# Patient Record
Sex: Male | Born: 1950 | Race: White | Hispanic: No | Smoking: Former smoker
Health system: Southern US, Community
[De-identification: ages and names within clinical notes are randomized; demographics above are authoritative.]

## PROBLEM LIST (undated history)

## (undated) DIAGNOSIS — E785 Hyperlipidemia, unspecified: Secondary | ICD-10-CM

## (undated) DIAGNOSIS — D649 Anemia, unspecified: Secondary | ICD-10-CM

## (undated) DIAGNOSIS — M51369 Other intervertebral disc degeneration, lumbar region without mention of lumbar back pain or lower extremity pain: Secondary | ICD-10-CM

## (undated) DIAGNOSIS — M5136 Other intervertebral disc degeneration, lumbar region: Secondary | ICD-10-CM

## (undated) DIAGNOSIS — G4730 Sleep apnea, unspecified: Secondary | ICD-10-CM

## (undated) DIAGNOSIS — K219 Gastro-esophageal reflux disease without esophagitis: Secondary | ICD-10-CM

## (undated) DIAGNOSIS — E119 Type 2 diabetes mellitus without complications: Secondary | ICD-10-CM

## (undated) DIAGNOSIS — I219 Acute myocardial infarction, unspecified: Secondary | ICD-10-CM

## (undated) DIAGNOSIS — I2510 Atherosclerotic heart disease of native coronary artery without angina pectoris: Secondary | ICD-10-CM

## (undated) DIAGNOSIS — M1990 Unspecified osteoarthritis, unspecified site: Secondary | ICD-10-CM

## (undated) DIAGNOSIS — I10 Essential (primary) hypertension: Secondary | ICD-10-CM

## (undated) DIAGNOSIS — J302 Other seasonal allergic rhinitis: Secondary | ICD-10-CM

## (undated) DIAGNOSIS — Z972 Presence of dental prosthetic device (complete) (partial): Secondary | ICD-10-CM

## (undated) DIAGNOSIS — G629 Polyneuropathy, unspecified: Secondary | ICD-10-CM

## (undated) DIAGNOSIS — J189 Pneumonia, unspecified organism: Secondary | ICD-10-CM

## (undated) DIAGNOSIS — F419 Anxiety disorder, unspecified: Secondary | ICD-10-CM

## (undated) HISTORY — PX: CERVICAL FUSION: SHX112

## (undated) HISTORY — PX: COLONOSCOPY: SHX174

## (undated) HISTORY — PX: LUMBAR LAMINECTOMY: SHX95

---

## 1981-08-26 HISTORY — PX: ABDOMINAL SURGERY: SHX537

## 1984-08-26 HISTORY — PX: ABDOMINAL ADHESION SURGERY: SHX90

## 2003-07-08 ENCOUNTER — Ambulatory Visit (HOSPITAL_COMMUNITY): Admission: RE | Admit: 2003-07-08 | Discharge: 2003-07-08 | Payer: Self-pay | Admitting: *Deleted

## 2003-09-06 ENCOUNTER — Ambulatory Visit (HOSPITAL_COMMUNITY): Admission: RE | Admit: 2003-09-06 | Discharge: 2003-09-06 | Payer: Self-pay | Admitting: Neurosurgery

## 2003-10-27 ENCOUNTER — Encounter: Admission: RE | Admit: 2003-10-27 | Discharge: 2003-10-27 | Payer: Self-pay | Admitting: Neurosurgery

## 2008-12-22 ENCOUNTER — Encounter: Admission: RE | Admit: 2008-12-22 | Discharge: 2008-12-22 | Payer: Self-pay | Admitting: Geriatric Medicine

## 2009-02-10 ENCOUNTER — Ambulatory Visit (HOSPITAL_COMMUNITY): Admission: RE | Admit: 2009-02-10 | Discharge: 2009-02-10 | Payer: Self-pay | Admitting: Neurosurgery

## 2009-08-26 DIAGNOSIS — I219 Acute myocardial infarction, unspecified: Secondary | ICD-10-CM | POA: Insufficient documentation

## 2009-08-26 HISTORY — DX: Acute myocardial infarction, unspecified: I21.9

## 2009-08-26 HISTORY — PX: CARDIAC CATHETERIZATION: SHX172

## 2010-04-16 ENCOUNTER — Encounter: Admission: RE | Admit: 2010-04-16 | Discharge: 2010-04-16 | Payer: Self-pay | Admitting: Neurosurgery

## 2010-09-15 ENCOUNTER — Encounter: Payer: Self-pay | Admitting: *Deleted

## 2010-09-25 LAB — TYPE AND SCREEN
ABO/RH(D): A NEG
Antibody Screen: NEGATIVE

## 2010-09-25 LAB — CBC
HCT: 40.4 % (ref 39.0–52.0)
MCHC: 35.6 g/dL (ref 30.0–36.0)
RDW: 11.8 % (ref 11.5–15.5)
WBC: 5.9 10*3/uL (ref 4.0–10.5)

## 2010-09-25 LAB — DIFFERENTIAL
Basophils Absolute: 0 10*3/uL (ref 0.0–0.1)
Basophils Relative: 0 % (ref 0–1)
Lymphocytes Relative: 24 % (ref 12–46)
Neutro Abs: 3.9 10*3/uL (ref 1.7–7.7)
Neutrophils Relative %: 67 % (ref 43–77)

## 2010-09-25 LAB — BASIC METABOLIC PANEL
BUN: 11 mg/dL (ref 6–23)
Calcium: 9.8 mg/dL (ref 8.4–10.5)
GFR calc non Af Amer: >60 mL/min (ref >60)
Glucose, Bld: 149 mg/dL — ABNORMAL HIGH (ref 70–99)
Potassium: 4.2 mEq/L (ref 3.5–5.1)
Sodium: 140 mEq/L (ref 135–145)

## 2010-09-25 LAB — SURGICAL PCR SCREEN: Staphylococcus aureus: NEGATIVE

## 2010-09-28 ENCOUNTER — Ambulatory Visit (HOSPITAL_COMMUNITY)
Admission: RE | Admit: 2010-09-28 | Discharge: 2010-09-29 | Disposition: A | Discharge status: Home / Self Care | Payer: BC Managed Care – PPO | Attending: Neurosurgery | Admitting: Neurosurgery

## 2010-09-28 ENCOUNTER — Ambulatory Visit (HOSPITAL_COMMUNITY): Payer: BC Managed Care – PPO

## 2010-09-28 ENCOUNTER — Encounter (HOSPITAL_COMMUNITY): Payer: BC Managed Care – PPO

## 2010-09-28 ENCOUNTER — Other Ambulatory Visit (HOSPITAL_COMMUNITY): Payer: Self-pay | Admitting: Neurosurgery

## 2010-09-28 DIAGNOSIS — Z01811 Encounter for preprocedural respiratory examination: Secondary | ICD-10-CM

## 2010-09-28 DIAGNOSIS — M5126 Other intervertebral disc displacement, lumbar region: Principal | ICD-10-CM | POA: Insufficient documentation

## 2010-09-28 DIAGNOSIS — I2510 Atherosclerotic heart disease of native coronary artery without angina pectoris: Secondary | ICD-10-CM | POA: Insufficient documentation

## 2010-09-28 DIAGNOSIS — K219 Gastro-esophageal reflux disease without esophagitis: Secondary | ICD-10-CM | POA: Insufficient documentation

## 2010-09-28 DIAGNOSIS — I10 Essential (primary) hypertension: Secondary | ICD-10-CM | POA: Insufficient documentation

## 2010-11-01 NOTE — Op Note (Signed)
NAMEMarland Mcguire  MAHAMUD, METTS                  ACCOUNT NO.:  [id number]  MEDICAL RECORD NO.:  [id number]           PATIENT TYPE:  O  LOCATION:  3536                         FACILITY:  [facility]  PHYSICIAN:  Catherine Oak A. Catheryn Slifer, M.D.    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  PROCEDURE:  Re-exploration of left L4-5 laminotomy with left L4-5 redo microdiskectomy.  SURGEON:  Kathaleen Maser. Elloise Roark, MD  ASSISTANT:  Donalee Citrin, MD  ANESTHESIA:  General endotracheal.  INDICATION:  [name] is a 60 year old male status post previous bilateral L4-5 decompressive laminotomies who presents now with signs and symptoms of a recurrent disk herniation at L4-5.  The patient has failed conservative management.  MRI scan demonstrates a large left- sided L4-5 recurrent disk herniation and plan is for re-exploration of laminotomy with redo microdiskectomy.  OPERATIVE NOTE:  The patient was placed on operating table in supine position.  After an adequate level of anesthesia achieved, the patient was placed prone onto Wilson frame.  Appropriately padded the patient's lumbar region, prepped and draped sterilely.  A 10 blade was used to make a curvilinear skin incision overlying the L4-5 interspace.  This was carried down sharply in midline. Subperiosteal dissection was then performed exposing the lamina and facet joints L4 and L5.  Deep self- retaining retractor was placed.  Intraoperative x-rays taken, and the level was confirmed.  Intraoperative x-rays taken level was confirmed. Previous laminotomy site was then dissected free using dental instruments.  The laminotomy was widened slightly using 2-mm Kerrison rongeurs.  Underlying thecal sac and L5 nerve  root were identified. Microscope was then brought into the field.  Using microdissection of the left-sided L5 nerve root and underlying disk herniation.  Epidural venous plexus was coagulated and cut.  L5 nerve root and thecal sac was gently mobilized towards the midline.  Disk herniation was apparent, this incised with 15 blade in a rectangular fashion.  Wide disk space clean-out was then achieved using pituitary rongeurs, orthopaedic rongeurs, and Epstein curettes.  All amounts of the disk herniation were completely resected.  All loose degenerative disk material was then removed from the interspace.  At this point, a very thorough diskectomy was achieved.  There was no injury to thecal sac or nerve root.  The wound was then irrigated with antibiotic solution.  Gelfoam was placed topically for hemostasis and found to be good.  Microscope and retractor system were removed. Hemostasis was then achieve with electrocautery.  The wound was closed in layers with Vicryl suture.  Steri-Strips and sterile dressings were applied.  No complications.  The patient is well, and he returns to recovery room postoperatively.          ______________________________ Kathaleen Maser Adyn Serna, M.D.     HAP/MEDQ  D:  09/28/2010  T:  09/28/2010  Job:  [id number]  Electronically Signed by Julio Sicks M.D. on 10/31/2010 11:19:29  PM

## 2010-12-03 LAB — CBC
MCHC: 35.2 g/dL (ref 30.0–36.0)
MCV: 94.6 fL (ref 78.0–100.0)
Platelets: 197 10*3/uL (ref 150–400)
RDW: 12.9 % (ref 11.5–15.5)

## 2010-12-03 LAB — TYPE AND SCREEN
ABO/RH(D): A NEG
Antibody Screen: NEGATIVE

## 2010-12-03 LAB — DIFFERENTIAL
Basophils Absolute: 0 10*3/uL (ref 0.0–0.1)
Basophils Relative: 0 % (ref 0–1)
Eosinophils Absolute: 0.1 10*3/uL (ref 0.0–0.7)
Monocytes Relative: 7 % (ref 3–12)
Neutro Abs: 3.9 10*3/uL (ref 1.7–7.7)
Neutrophils Relative %: 71 % (ref 43–77)

## 2011-01-08 NOTE — Op Note (Signed)
NAME:  Bryan Mcguire, Bryan Mcguire                  ACCOUNT NO.:  [id number]   MEDICAL RECORD NO.:  [id number]          PATIENT TYPE:  OIB   LOCATION:  3524                         FACILITY:  [facility]   PHYSICIAN:  Kathaleen Maser. Pool, M.D.    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   POSTOPERATIVE DIAGNOSES:  L4-5 stenosis with left L4-5 herniated nucleus  pulposus and radiculopathy.   PROCEDURE NAME:  Bilateral L4-5 decompressive laminotomies with left L4-  5 microdiskectomy.   SURGEON:  Kathaleen Maser. Pool, MD   ASSISTANT:  Tia Alert, MD   ANESTHESIA:  General endotracheal.   INDICATIONS:  Bryan Mcguire is a 60 year old male with history of bilateral  lower extremity pain, paresthesias, and weakness consistent with  neurogenic claudication with a superimposed left L5 radiculopathy.  Patient has failed conservative management.  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  The patient has been counseled as to his options.  He decided to proceed  with L4-5 decompressive laminotomies and left-sided L4-5 microdiskectomy  in hopes of improving his symptoms.   OPERATION:  The patient was brought to the operating room, placed on the  operating table in supine position.  After adequate level of anesthesia  was achieved, the patient was placed prone onto Wilson frame,  appropriately padded.  The patient's lumbar region was prepped and  draped in sterilely.  A #10 blade was used to make a curvilinear skin  incision overlying the L4-5 interspace.  This was carried down sharply  in the midline.  A subperiosteal dissection was then performed exposing  the lamina and facet joints of L4 and L5.  A deep  self-retaining  retractor was placed.  Intraoperative x-ray was taken, and in fact, the  L3-4 level was exposed.  Dissection was then redirected to the L4-5  level.  Retractor was then placed.  Bilateral laminotomies were then  performed using high-speed drill and Kerrison rongeurs to remove the  inferior aspect of the lamina, above medial aspect of the L4-5 facet  joint, superior rim of the L5 lamina.  Ligament flavum was then elevated  and resected in piecemeal fashion using Kerrison rongeurs.  Underlying  thecal sac and exiting L5 nerve roots were identified bilaterally.  Wide  decompressive foraminotomies were then performed along the course of  exiting L4 and L5 nerve roots bilaterally.  Epidural venous plexus was  coagulated and cut.  Microscope was then brought into the field and used  for microdissection.  Starting on the left side, thecal sac and nerve  roots were gently mobilized, retracted towards the midline.  Disk  herniation was then apparent.  This was then incised with #15 blade in a  rectangular fashion.  Wide disk space clean-out was achieved using  pituitary rongeurs, upward angled pituitary rongeurs, and Epstein  curettes.  All elements of the disk herniation were completely resected.  All loose or obviously degenerative disk material was then removed from  the interspace.  At this point, a very thorough diskectomy was achieved.  There was no obvious injury to thecal sac or nerve roots.  The wound was  then irrigated with antibiotic solution.  Gelfoam was placed topically  for hemostasis which was found to be good.  Microscope and retractor  system were removed.  Hemostasis  was then achieved with electrocautery.  Wound was then closed in layers  with Vicryl sutures.  Steri-Strips and sterile dressing were applied.  There were no complications.  The patient tolerated the procedure well  and he returned to the recovery room postoperatively.            ______________________________  Kathaleen Maser Pool, M.D.     HAP/MEDQ  D:  02/10/2009  T:  02/11/2009  Job:  [id number]

## 2011-01-11 NOTE — Op Note (Signed)
NAMEMarland Kitchen  Bryan Mcguire, Bryan Mcguire                              ACCOUNT NO.:  [id number]   MEDICAL RECORD NO.:  [id number]                   PATIENT TYPE:  OIB   LOCATION:  3013                                 FACILITY:  [facility]   PHYSICIAN:  Kathaleen Maser. Mcguire, M.D.                 DATE OF BIRTH:  1951-02-11   DATE OF PROCEDURE:  09/06/2003  DATE OF DISCHARGE:                                 OPERATIVE REPORT   PREOPERATIVE DIAGNOSIS:  C5-6 and C6-7 herniated nucleus pulposus with  myelopathy.   POSTOPERATIVE DIAGNOSIS:  C5-6 and C6-7 herniated nucleus pulposus with  myelopathy.   PROCEDURE:  C5-6 and C6-7 anterior cervical decompression and fusion with  allograft and anterior plating.   SURGEON:  Kathaleen Maser. Mcguire, M.D.   ASSISTANT:  Tia Alert, MD   ANESTHESIA:  General endotracheal anesthesia.   INDICATIONS FOR PROCEDURE:  The patient is a 60 year old male with a history  of neck and left upper extremity pain, paresthesias and weakness consistent  with both a left sided C6 and C7 radiculopathy as well as early evidence of  a cervical myelopathy as well.  MRI scanning demonstrates very large disk  herniations off to the left side with associated spondylosis at C5-6 and C6-  7 causing marked spinal cord and exiting nerve root compression.  The  patient has been counseled as to his options.  He has decided to proceed  with two level anterior cervical decompression and fusion with Allograft  anterior plating.   DESCRIPTION OF PROCEDURE:  The patient was brought to the operating room and  placed on the operating table in the supine position.  After an adequate  level of anesthesia was achieved the patient was placed supine with the neck  slightly extended and the head placed in halter traction.  The patient's  anterior cervical region was prepped and draped sterilely.  A 10 blade was  used to make a linear incision overlying the C6 vertebral level. This was  carried down sharply to the platysma.  The  platysma was divided vertically  and dissection proceeded along the medial border of the sternocleidomastoid  muscle and carotid sheath.  The trachea and esophagus were mobilized and  retracted towards the left.  The prevertebral fascia was stripped off the  anterior spinal column.  The longus colli muscles and elevated bilaterally  using electrocautery.  A deep self retaining retractor was placed.  Intraoperative fluoroscopy was used and the C5-6 and C6-7 levels were  confirmed.  The disk spaces at both levels were then incised with a 15 blade  in a rectangular fashion to widen the disk space was then created and  achieved using pituitary rongeurs, forward and backward biting Carlen  curettes, Kerrison rongeur and a high speed drill.  All the rest of the  disks was removed down to the level of the posterior annulus at both  levels.  The microscope was brought onto the field to carry out the remainder of the  diskectomy removing the anterior aspect of the annulus.  The osteophytes  were removed using the high speed drill down to the level of the posterior  longitudinal ligament.  The posterior longitudinal ligament was then  elevated and resected in a piecemeal fashion using Kerrison.  The underlying  thecal sac was then identified.  A wide central decompression was performed  by under cutting the bodies of C5 and C6.  Decompression then proceeded out  to the anterior foramen.  A wide anterior foraminotomy was performed along  the course of the exiting C6 nerve root.  The spondylitic spur and disk  herniation were completely resected.  Decompression then proceeded to the  right sided C6 foramen.  Once again, a wide anterior foraminotomy was  performed fully exposing the exiting nerve root.  A blunt probe was used to  dissect both superiorly and inferiorly at each foramen.  The diskectomy was  completed at the C6-7 level in a similar fashion.  There were no  complications with either level.   The wounds were then irrigated with  antibiotic solution.  The wound was inspected for hemostasis and was found  to be good.  A 7 mm patellar wedge allograft was then impacted in place and  recessed approximately 1 mm from the anterior cortical margin at C5-6.  An 8  mm patellar wedge was impacted at C6-7.  A 45 mm Atlantis anterior cervical  plate was then placed over the C5-6 and C6-7 levels.  This was then attached  under fluoroscopic guidance using 15 mm variable angle screws. All six  screws were again finally tightened and found to be solid in the bone.  The  locking screws were engaged at all three levels.  Final images revealed good  position of the bone graft and hardware and proper level in the spine.  The  wound was then irrigated with saline solution.  Hemostasis was ensured with  bipolar electrocautery.  The wound was then closed in a typical fashion.  Steri-Strips and a sterile dressing were applied.  There were no  complications.  The patient tolerated the procedure well and he returns to  the recovery room postoperatively.                                               Bryan Mcguire, M.D.    HAP/MEDQ  D:  09/06/2003  T:  09/06/2003  Job:  [id number]

## 2012-07-30 ENCOUNTER — Other Ambulatory Visit: Payer: Self-pay | Admitting: Orthopedic Surgery

## 2012-07-30 ENCOUNTER — Encounter (HOSPITAL_BASED_OUTPATIENT_CLINIC_OR_DEPARTMENT_OTHER): Payer: Self-pay | Admitting: *Deleted

## 2012-07-30 NOTE — Progress Notes (Signed)
Pt came in from office before he was scheduled He had labs about a month ago-sees dr Dulce Sellar cardiology-called for notes from both offices to send today, Had lumb lam cone 2012

## 2012-07-31 ENCOUNTER — Encounter (HOSPITAL_BASED_OUTPATIENT_CLINIC_OR_DEPARTMENT_OTHER): Payer: Self-pay | Admitting: Orthopedic Surgery

## 2012-07-31 ENCOUNTER — Encounter (HOSPITAL_BASED_OUTPATIENT_CLINIC_OR_DEPARTMENT_OTHER): Payer: Self-pay | Admitting: Anesthesiology

## 2012-07-31 ENCOUNTER — Encounter (HOSPITAL_BASED_OUTPATIENT_CLINIC_OR_DEPARTMENT_OTHER)
Admission: RE | Disposition: A | Discharge status: Home / Self Care | Payer: Self-pay | Source: Ambulatory Visit | Attending: Orthopedic Surgery

## 2012-07-31 ENCOUNTER — Encounter (HOSPITAL_BASED_OUTPATIENT_CLINIC_OR_DEPARTMENT_OTHER): Payer: Self-pay | Admitting: *Deleted

## 2012-07-31 ENCOUNTER — Ambulatory Visit (HOSPITAL_BASED_OUTPATIENT_CLINIC_OR_DEPARTMENT_OTHER)
Admission: RE | Admit: 2012-07-31 | Discharge: 2012-07-31 | Disposition: A | Discharge status: Home / Self Care | Payer: Medicare Other | Source: Ambulatory Visit | Attending: Orthopedic Surgery | Admitting: Orthopedic Surgery

## 2012-07-31 ENCOUNTER — Ambulatory Visit (HOSPITAL_BASED_OUTPATIENT_CLINIC_OR_DEPARTMENT_OTHER): Payer: Medicare Other | Admitting: Anesthesiology

## 2012-07-31 DIAGNOSIS — I10 Essential (primary) hypertension: Secondary | ICD-10-CM | POA: Insufficient documentation

## 2012-07-31 DIAGNOSIS — M6530 Trigger finger, unspecified finger: Secondary | ICD-10-CM | POA: Insufficient documentation

## 2012-07-31 DIAGNOSIS — M65839 Other synovitis and tenosynovitis, unspecified forearm: Secondary | ICD-10-CM | POA: Insufficient documentation

## 2012-07-31 HISTORY — DX: Gastro-esophageal reflux disease without esophagitis: K21.9

## 2012-07-31 HISTORY — DX: Other seasonal allergic rhinitis: J30.2

## 2012-07-31 HISTORY — PX: TRIGGER FINGER RELEASE: SHX641

## 2012-07-31 HISTORY — DX: Acute myocardial infarction, unspecified: I21.9

## 2012-07-31 HISTORY — DX: Essential (primary) hypertension: I10

## 2012-07-31 HISTORY — DX: Unspecified osteoarthritis, unspecified site: M19.90

## 2012-07-31 HISTORY — DX: Polyneuropathy, unspecified: G62.9

## 2012-07-31 HISTORY — DX: Atherosclerotic heart disease of native coronary artery without angina pectoris: I25.10

## 2012-07-31 HISTORY — DX: Presence of dental prosthetic device (complete) (partial): Z97.2

## 2012-07-31 HISTORY — DX: Other intervertebral disc degeneration, lumbar region: M51.36

## 2012-07-31 HISTORY — DX: Other intervertebral disc degeneration, lumbar region without mention of lumbar back pain or lower extremity pain: M51.369

## 2012-07-31 SURGERY — RELEASE, A1 PULLEY, FOR TRIGGER FINGER
Anesthesia: Monitor Anesthesia Care | Site: Finger | Laterality: Right | Wound class: Clean

## 2012-07-31 MED ORDER — LACTATED RINGERS IV SOLN
INTRAVENOUS | Status: DC
  Administered 2012-07-31: 12:00:00 via INTRAVENOUS

## 2012-07-31 MED ORDER — 0.9 % SODIUM CHLORIDE (POUR BTL) OPTIME
TOPICAL | Status: DC | PRN
  Administered 2012-07-31: 200 mL

## 2012-07-31 MED ORDER — CEFAZOLIN SODIUM-DEXTROSE 2-3 GM-% IV SOLR: 2.0000 g | INTRAVENOUS | Status: DC

## 2012-07-31 MED ORDER — KETOROLAC TROMETHAMINE 30 MG/ML IJ SOLN
INTRAMUSCULAR | Status: DC | PRN
  Administered 2012-07-31: 30 mg via INTRAVENOUS

## 2012-07-31 MED ORDER — HYDROCODONE-ACETAMINOPHEN 5-325 MG PO TABS: 1.0000 | ORAL_TABLET | Freq: Four times a day (QID) | ORAL | Status: DC | PRN

## 2012-07-31 MED ORDER — PROPOFOL 10 MG/ML IV EMUL
INTRAVENOUS | Status: DC | PRN
  Administered 2012-07-31: 100 ug/kg/min via INTRAVENOUS

## 2012-07-31 MED ORDER — LIDOCAINE HCL (PF) 0.5 % IJ SOLN
INTRAMUSCULAR | Status: DC | PRN
  Administered 2012-07-31: 30 mL via INTRAVENOUS

## 2012-07-31 MED ORDER — MIDAZOLAM HCL 5 MG/5ML IJ SOLN
INTRAMUSCULAR | Status: DC | PRN
  Administered 2012-07-31 (×2): 1 mg via INTRAVENOUS

## 2012-07-31 MED ORDER — CHLORHEXIDINE GLUCONATE 4 % EX LIQD: 60.0000 mL | Freq: Once | CUTANEOUS | Status: DC

## 2012-07-31 MED ORDER — LIDOCAINE HCL (CARDIAC) 20 MG/ML IV SOLN
INTRAVENOUS | Status: DC | PRN
  Administered 2012-07-31: 30 mg via INTRAVENOUS

## 2012-07-31 MED ORDER — HYDROMORPHONE HCL PF 1 MG/ML IJ SOLN: 0.2500 mg | INTRAMUSCULAR | Status: DC | PRN

## 2012-07-31 MED ORDER — ONDANSETRON HCL 4 MG/2ML IJ SOLN
INTRAMUSCULAR | Status: DC | PRN
  Administered 2012-07-31: 4 mg via INTRAVENOUS

## 2012-07-31 MED ORDER — KETOROLAC TROMETHAMINE 15 MG/ML IJ SOLN: INTRAMUSCULAR | Status: DC | PRN

## 2012-07-31 MED ORDER — OXYCODONE HCL 5 MG/5ML PO SOLN: 5.0000 mg | Freq: Once | ORAL | Status: DC | PRN

## 2012-07-31 MED ORDER — FENTANYL CITRATE 0.05 MG/ML IJ SOLN
INTRAMUSCULAR | Status: DC | PRN
  Administered 2012-07-31 (×2): 50 ug via INTRAVENOUS

## 2012-07-31 MED ORDER — OXYCODONE HCL 5 MG PO TABS: 5.0000 mg | ORAL_TABLET | Freq: Once | ORAL | Status: DC | PRN

## 2012-07-31 MED ORDER — BUPIVACAINE HCL (PF) 0.25 % IJ SOLN
INTRAMUSCULAR | Status: DC | PRN
  Administered 2012-07-31: 9 mL

## 2012-07-31 SURGICAL SUPPLY — 36 items
BANDAGE COBAN STERILE 2 (GAUZE/BANDAGES/DRESSINGS) ×2 IMPLANT
BLADE SURG 15 STRL LF DISP TIS (BLADE) ×1 IMPLANT
BLADE SURG 15 STRL SS (BLADE) ×1
BNDG ESMARK 4X9 LF (GAUZE/BANDAGES/DRESSINGS) IMPLANT
CHLORAPREP W/TINT 26ML (MISCELLANEOUS) ×2 IMPLANT
CLOTH BEACON ORANGE TIMEOUT ST (SAFETY) ×2 IMPLANT
CORDS BIPOLAR (ELECTRODE) ×2 IMPLANT
COVER MAYO STAND STRL (DRAPES) ×2 IMPLANT
COVER TABLE BACK 60X90 (DRAPES) ×2 IMPLANT
CUFF TOURNIQUET SINGLE 18IN (TOURNIQUET CUFF) ×2 IMPLANT
DECANTER SPIKE VIAL GLASS SM (MISCELLANEOUS) ×2 IMPLANT
DRAPE EXTREMITY T 121X128X90 (DRAPE) ×2 IMPLANT
DRAPE SURG 17X23 STRL (DRAPES) ×2 IMPLANT
GAUZE XEROFORM 1X8 LF (GAUZE/BANDAGES/DRESSINGS) ×2 IMPLANT
GLOVE BIO SURGEON STRL SZ 6.5 (GLOVE) ×2 IMPLANT
GLOVE BIO SURGEON STRL SZ7 (GLOVE) ×2 IMPLANT
GLOVE BIOGEL PI IND STRL 7.0 (GLOVE) ×1 IMPLANT
GLOVE BIOGEL PI IND STRL 8.5 (GLOVE) ×1 IMPLANT
GLOVE BIOGEL PI INDICATOR 7.0 (GLOVE) ×1
GLOVE BIOGEL PI INDICATOR 8.5 (GLOVE) ×1
GLOVE SURG ORTHO 8.0 STRL STRW (GLOVE) ×2 IMPLANT
GOWN BRE IMP PREV XXLGXLNG (GOWN DISPOSABLE) ×2 IMPLANT
GOWN PREVENTION PLUS XLARGE (GOWN DISPOSABLE) ×2 IMPLANT
NEEDLE 27GAX1X1/2 (NEEDLE) ×2 IMPLANT
NS IRRIG 1000ML POUR BTL (IV SOLUTION) ×2 IMPLANT
PACK BASIN DAY SURGERY FS (CUSTOM PROCEDURE TRAY) ×2 IMPLANT
PADDING CAST ABS 4INX4YD NS (CAST SUPPLIES) ×1
PADDING CAST ABS COTTON 4X4 ST (CAST SUPPLIES) ×1 IMPLANT
SPONGE GAUZE 4X4 12PLY (GAUZE/BANDAGES/DRESSINGS) ×2 IMPLANT
STOCKINETTE 4X48 STRL (DRAPES) ×2 IMPLANT
SUT VICRYL RAPIDE 4/0 PS 2 (SUTURE) ×2 IMPLANT
SYR BULB 3OZ (MISCELLANEOUS) ×2 IMPLANT
SYR CONTROL 10ML LL (SYRINGE) ×2 IMPLANT
TOWEL OR 17X24 6PK STRL BLUE (TOWEL DISPOSABLE) ×2 IMPLANT
UNDERPAD 30X30 INCONTINENT (UNDERPADS AND DIAPERS) ×2 IMPLANT
WATER STERILE IRR 1000ML POUR (IV SOLUTION) IMPLANT

## 2012-07-31 NOTE — Brief Op Note (Signed)
07/31/2012  1:25 PM  PATIENT:  Bryan Mcguire  61 y.o. male  PRE-OPERATIVE DIAGNOSIS:  Stenosing Tenosynovitis Right Middle Finger, Right Ring Finger, and Right Small Finger - Hand  Pain and Stiffness Fingers  POST-OPERATIVE DIAGNOSIS:  Stenosing Tenosynovitis Right Middle Finger, Right Ring Finger, and Right Small Finger - Hand  Pain and Stiffness Fingers  PROCEDURE:  Procedure(s) (LRB) with comments: RELEASE TRIGGER FINGER/A-1 PULLEY (Right) - Right Middle Finger; Right Ring Finger, and Right Small Finger  SURGEON:  Surgeon(s) and Role:    * Nicki Reaper, MD - Primary  PHYSICIAN ASSISTANT:   ASSISTANTS: none   ANESTHESIA:   local and regional  EBL:  Total I/O In: 800 [I.V.:800] Out: -   BLOOD ADMINISTERED:none  DRAINS: none   LOCAL MEDICATIONS USED:  MARCAINE     SPECIMEN:  No Specimen  DISPOSITION OF SPECIMEN:  N/A  COUNTS:  YES  TOURNIQUET:   Total Tourniquet Time Documented: Forearm (Right) - 27 minutes  DICTATION: .Other Dictation: Dictation Number [id number] PLAN OF CARE: Discharge to home after PACU  PATIENT DISPOSITION:  PACU - hemodynamically stable.
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
## 2012-07-31 NOTE — Op Note (Signed)
Other Dictation: Dictation Number [id number]

## 2012-07-31 NOTE — Anesthesia Preprocedure Evaluation (Signed)
Anesthesia Evaluation  Patient identified by MRN, date of birth, ID band Patient awake    Reviewed: Allergy & Precautions, H&P , NPO status , Patient's Chart, lab work & pertinent test results, reviewed documented beta blocker date and time   Airway Mallampati: III TM Distance: >3 FB Neck ROM: Full    Dental No notable dental hx. (+) Upper Dentures and Dental Advisory Given   Pulmonary neg pulmonary ROS,  breath sounds clear to auscultation  Pulmonary exam normal       Cardiovascular hypertension, On Medications and On Home Beta Blockers + CAD, + Past MI and + Cardiac Stents Rhythm:Regular Rate:Normal     Neuro/Psych negative neurological ROS  negative psych ROS   GI/Hepatic negative GI ROS, Neg liver ROS, GERD-  Medicated and Controlled,  Endo/Other  negative endocrine ROS  Renal/GU negative Renal ROS  negative genitourinary   Musculoskeletal   Abdominal   Peds  Hematology negative hematology ROS (+)   Anesthesia Other Findings   Reproductive/Obstetrics negative OB ROS                           Anesthesia Physical Anesthesia Plan  ASA: III  Anesthesia Plan: MAC and Bier Block   Post-op Pain Management:    Induction: Intravenous  Airway Management Planned: Simple Face Mask  Additional Equipment:   Intra-op Plan:   Post-operative Plan:   Informed Consent: I have reviewed the patients History and Physical, chart, labs and discussed the procedure including the risks, benefits and alternatives for the proposed anesthesia with the patient or authorized representative who has indicated his/her understanding and acceptance.   Dental advisory given  Plan Discussed with: CRNA  Anesthesia Plan Comments:         Anesthesia Quick Evaluation

## 2012-07-31 NOTE — Anesthesia Procedure Notes (Signed)
Procedure Name: MAC Date/Time: 07/31/2012 12:45 PM Performed by: Verlan Friends Pre-anesthesia Checklist: Patient identified, Timeout performed, Emergency Drugs available, Suction available and Patient being monitored Patient Re-evaluated:Patient Re-evaluated prior to inductionOxygen Delivery Method: Simple face mask Placement Confirmation: positive ETCO2

## 2012-07-31 NOTE — Transfer of Care (Signed)
Immediate Anesthesia Transfer of Care Note  Patient: Bryan Mcguire  Procedure(s) Performed: Procedure(s) (LRB) with comments: RELEASE TRIGGER FINGER/A-1 PULLEY (Right) - Right Middle Finger; Right Ring Finger, and Right Small Finger  Patient Location: PACU  Anesthesia Type:Bier block  Level of Consciousness: awake, alert , oriented and patient cooperative  Airway & Oxygen Therapy: Patient Spontanous Breathing and Patient connected to face mask oxygen  Post-op Assessment: Report given to PACU RN and Post -op Vital signs reviewed and stable  Post vital signs: Reviewed and stable  Complications: No apparent anesthesia complications

## 2012-08-01 NOTE — Op Note (Signed)
NAMEMarland Kitchen  Bryan Mcguire, Bryan Mcguire                  ACCOUNT NO.:  [id number]  MEDICAL RECORD NO.:  [id number]  LOCATION:                                 FACILITY:  PHYSICIAN:  Cindee Salt, M.D.            DATE OF BIRTH:  DATE OF PROCEDURE:  07/31/2012 DATE OF DISCHARGE:                              OPERATIVE REPORT   PREOPERATIVE DIAGNOSIS:  Stenosing tenosynovitis, right middle ring and little fingers.  POSTOPERATIVE DIAGNOSIS:  Stenosing tenosynovitis, right middle ring and little fingers.  OPERATION:  Decompression of A1 pulleys, right middle, right ring, right little finger.  SURGEON:  Cindee Salt, MD  ANESTHESIA:  Forearm-based IV regional with local infiltration.  ANESTHESIOLOGIST:  Zenon Mayo, MD  HISTORY:  The patient is a 61 year old male with a history of triggering of his multiple digits, right hand.  This has not responded to conservative treatment.  He has elected to undergo surgical release of his A1 pulley of middle, ring, and little fingers.  Pre, peri, and postoperative course have been discussed along with risks and complications.  He is aware that there is no guarantee with surgery; possibility of infection; recurrence of injury to arteries, nerves, tendons, incomplete relief of symptoms, and dystrophy.  In preoperative area, the patient was seen, the extremity marked by both the patient and surgeon.  Antibiotic given.  PROCEDURE IN DETAIL:  The patient was brought to the operating room. The forearm-based IV regional anesthetic was carried out.  After adequate anesthesia was afforded, an oblique incision was made over the middle, followed by ring, followed by little finger.  The A1 pulley was identified in the middle finger, carried down through subcutaneous tissue.  The A1 pulley was identified.  Retractors placed protecting the neurovascular bundles radially and ulnarly.  The A1 pulley was released on its radial aspect, a small incision was made centrally in  A2, and the patient was able to flex extend.  No further triggering was noted to the middle finger.  The palmar fascia was released proximally with partial synovectomy performed.  The incision on the ring finger was deepened with blunt dissection.  The A1 pulley identified in a similar manner to the middle finger.  Retractors placed and the A1 pulley  released on its radial aspect.  Small incision was made centrally in A2, and full flexion, extension revealed no further triggering after release of palmar pulley and partial synovectomy.  The little finger attended to next.  The incision deepened with blunt dissection.  The retractors placed protecting neurovascular structures.  The A1 pulley was released in its radial aspect, a small incision was made centrally in A2, palmar pulley released and a partial synovectomy performed proximally.  No further triggering was noted.  The wounds were copiously irrigated with saline.  When the initial incision was made, the area was infiltrated with 0.25% Marcaine without epinephrine, approximately 8 mL was used. Bleeders were electrocauterized throughout the procedure with bipolar. The wound was then closed after irrigation with interrupted 4-0 Vicryl Rapide sutures.  A sterile compressive dressing was applied with the fingers free.  On deflation of the tourniquet, all fingers  immediately pinked.  He was taken to the recovery room for observation in satisfactory condition.  He will be discharged home to return in 1 week on Vicodin.          ______________________________ Cindee Salt, M.D.     GK/MEDQ  D:  07/31/2012  T:  08/01/2012  Job:  [id number]

## 2012-08-03 ENCOUNTER — Encounter (HOSPITAL_BASED_OUTPATIENT_CLINIC_OR_DEPARTMENT_OTHER): Payer: Self-pay | Admitting: Orthopedic Surgery

## 2012-08-10 NOTE — Anesthesia Postprocedure Evaluation (Signed)
  Anesthesia Post-op Note  Patient: Bryan Mcguire  Procedure(s) Performed: Procedure(s) (LRB) with comments: RELEASE TRIGGER FINGER/A-1 PULLEY (Right) - Right Middle Finger; Right Ring Finger, and Right Small Finger  Patient Location: PACU  Anesthesia Type:Bier block  Level of Consciousness: awake, alert  and oriented  Airway and Oxygen Therapy: Patient Spontanous Breathing  Post-op Pain: none  Post-op Assessment: Post-op Vital signs reviewed, Patient's Cardiovascular Status Stable, Respiratory Function Stable, Patent Airway and No signs of Nausea or vomiting  Post-op Vital Signs: Reviewed and stable  Complications: No apparent anesthesia complications

## 2015-09-20 DIAGNOSIS — R972 Elevated prostate specific antigen [PSA]: Secondary | ICD-10-CM | POA: Diagnosis not present

## 2015-09-20 DIAGNOSIS — E1165 Type 2 diabetes mellitus with hyperglycemia: Secondary | ICD-10-CM | POA: Diagnosis not present

## 2015-09-20 DIAGNOSIS — Z6828 Body mass index (BMI) 28.0-28.9, adult: Secondary | ICD-10-CM | POA: Diagnosis not present

## 2015-09-20 DIAGNOSIS — E663 Overweight: Secondary | ICD-10-CM | POA: Diagnosis not present

## 2015-09-21 DIAGNOSIS — G4733 Obstructive sleep apnea (adult) (pediatric): Secondary | ICD-10-CM | POA: Diagnosis not present

## 2015-09-26 DIAGNOSIS — R972 Elevated prostate specific antigen [PSA]: Secondary | ICD-10-CM | POA: Diagnosis not present

## 2015-09-26 DIAGNOSIS — N401 Enlarged prostate with lower urinary tract symptoms: Secondary | ICD-10-CM | POA: Diagnosis not present

## 2015-09-26 DIAGNOSIS — Z7689 Persons encountering health services in other specified circumstances: Secondary | ICD-10-CM | POA: Diagnosis not present

## 2015-10-22 DIAGNOSIS — G4733 Obstructive sleep apnea (adult) (pediatric): Secondary | ICD-10-CM | POA: Diagnosis not present

## 2015-11-19 DIAGNOSIS — G4733 Obstructive sleep apnea (adult) (pediatric): Secondary | ICD-10-CM | POA: Diagnosis not present

## 2015-12-18 DIAGNOSIS — G4733 Obstructive sleep apnea (adult) (pediatric): Secondary | ICD-10-CM | POA: Diagnosis not present

## 2015-12-20 DIAGNOSIS — G4733 Obstructive sleep apnea (adult) (pediatric): Secondary | ICD-10-CM | POA: Diagnosis not present

## 2016-01-15 DIAGNOSIS — D1801 Hemangioma of skin and subcutaneous tissue: Secondary | ICD-10-CM | POA: Diagnosis not present

## 2016-01-15 DIAGNOSIS — L821 Other seborrheic keratosis: Secondary | ICD-10-CM | POA: Diagnosis not present

## 2016-01-19 DIAGNOSIS — G4733 Obstructive sleep apnea (adult) (pediatric): Secondary | ICD-10-CM | POA: Diagnosis not present

## 2016-01-29 DIAGNOSIS — I25119 Atherosclerotic heart disease of native coronary artery with unspecified angina pectoris: Secondary | ICD-10-CM | POA: Insufficient documentation

## 2016-01-29 DIAGNOSIS — I10 Essential (primary) hypertension: Secondary | ICD-10-CM | POA: Insufficient documentation

## 2016-01-29 DIAGNOSIS — R5381 Other malaise: Secondary | ICD-10-CM | POA: Insufficient documentation

## 2016-01-29 DIAGNOSIS — I493 Ventricular premature depolarization: Secondary | ICD-10-CM | POA: Insufficient documentation

## 2016-01-29 DIAGNOSIS — IMO0002 Reserved for concepts with insufficient information to code with codable children: Secondary | ICD-10-CM | POA: Insufficient documentation

## 2016-01-29 DIAGNOSIS — E785 Hyperlipidemia, unspecified: Secondary | ICD-10-CM | POA: Insufficient documentation

## 2016-01-29 DIAGNOSIS — G4700 Insomnia, unspecified: Secondary | ICD-10-CM | POA: Insufficient documentation

## 2016-01-29 DIAGNOSIS — K635 Polyp of colon: Secondary | ICD-10-CM | POA: Insufficient documentation

## 2016-01-29 DIAGNOSIS — Z79899 Other long term (current) drug therapy: Secondary | ICD-10-CM | POA: Insufficient documentation

## 2016-01-29 DIAGNOSIS — R5383 Other fatigue: Secondary | ICD-10-CM | POA: Insufficient documentation

## 2016-01-29 DIAGNOSIS — E538 Deficiency of other specified B group vitamins: Secondary | ICD-10-CM | POA: Insufficient documentation

## 2016-01-29 DIAGNOSIS — R972 Elevated prostate specific antigen [PSA]: Secondary | ICD-10-CM | POA: Insufficient documentation

## 2016-01-29 DIAGNOSIS — E663 Overweight: Secondary | ICD-10-CM | POA: Insufficient documentation

## 2016-01-29 DIAGNOSIS — E1165 Type 2 diabetes mellitus with hyperglycemia: Secondary | ICD-10-CM | POA: Insufficient documentation

## 2016-01-31 DIAGNOSIS — I10 Essential (primary) hypertension: Secondary | ICD-10-CM | POA: Diagnosis not present

## 2016-01-31 DIAGNOSIS — E782 Mixed hyperlipidemia: Secondary | ICD-10-CM | POA: Diagnosis not present

## 2016-01-31 DIAGNOSIS — N529 Male erectile dysfunction, unspecified: Secondary | ICD-10-CM | POA: Diagnosis not present

## 2016-01-31 DIAGNOSIS — I25119 Atherosclerotic heart disease of native coronary artery with unspecified angina pectoris: Secondary | ICD-10-CM | POA: Diagnosis not present

## 2016-02-07 DIAGNOSIS — E538 Deficiency of other specified B group vitamins: Secondary | ICD-10-CM | POA: Diagnosis not present

## 2016-02-07 DIAGNOSIS — Z79899 Other long term (current) drug therapy: Secondary | ICD-10-CM | POA: Diagnosis not present

## 2016-02-07 DIAGNOSIS — I2510 Atherosclerotic heart disease of native coronary artery without angina pectoris: Secondary | ICD-10-CM | POA: Diagnosis not present

## 2016-02-07 DIAGNOSIS — E1165 Type 2 diabetes mellitus with hyperglycemia: Secondary | ICD-10-CM | POA: Diagnosis not present

## 2016-02-07 DIAGNOSIS — E782 Mixed hyperlipidemia: Secondary | ICD-10-CM | POA: Diagnosis not present

## 2016-02-07 DIAGNOSIS — E663 Overweight: Secondary | ICD-10-CM | POA: Diagnosis not present

## 2016-02-07 DIAGNOSIS — I10 Essential (primary) hypertension: Secondary | ICD-10-CM | POA: Diagnosis not present

## 2016-02-07 DIAGNOSIS — E039 Hypothyroidism, unspecified: Secondary | ICD-10-CM | POA: Diagnosis not present

## 2016-02-07 DIAGNOSIS — M5137 Other intervertebral disc degeneration, lumbosacral region: Secondary | ICD-10-CM | POA: Diagnosis not present

## 2016-02-19 DIAGNOSIS — G4733 Obstructive sleep apnea (adult) (pediatric): Secondary | ICD-10-CM | POA: Diagnosis not present

## 2016-02-23 DIAGNOSIS — G4733 Obstructive sleep apnea (adult) (pediatric): Secondary | ICD-10-CM | POA: Diagnosis not present

## 2016-02-23 DIAGNOSIS — R5383 Other fatigue: Secondary | ICD-10-CM | POA: Diagnosis not present

## 2016-02-29 DIAGNOSIS — G4733 Obstructive sleep apnea (adult) (pediatric): Secondary | ICD-10-CM | POA: Diagnosis not present

## 2016-03-20 DIAGNOSIS — G4733 Obstructive sleep apnea (adult) (pediatric): Secondary | ICD-10-CM | POA: Diagnosis not present

## 2016-04-20 DIAGNOSIS — G4733 Obstructive sleep apnea (adult) (pediatric): Secondary | ICD-10-CM | POA: Diagnosis not present

## 2016-08-22 DIAGNOSIS — G4733 Obstructive sleep apnea (adult) (pediatric): Secondary | ICD-10-CM | POA: Diagnosis not present

## 2016-10-14 DIAGNOSIS — I2510 Atherosclerotic heart disease of native coronary artery without angina pectoris: Secondary | ICD-10-CM | POA: Diagnosis not present

## 2016-10-14 DIAGNOSIS — Z79899 Other long term (current) drug therapy: Secondary | ICD-10-CM | POA: Diagnosis not present

## 2016-10-14 DIAGNOSIS — E039 Hypothyroidism, unspecified: Secondary | ICD-10-CM | POA: Diagnosis not present

## 2016-10-14 DIAGNOSIS — E538 Deficiency of other specified B group vitamins: Secondary | ICD-10-CM | POA: Diagnosis not present

## 2016-10-14 DIAGNOSIS — E1165 Type 2 diabetes mellitus with hyperglycemia: Secondary | ICD-10-CM | POA: Diagnosis not present

## 2016-10-14 DIAGNOSIS — M5137 Other intervertebral disc degeneration, lumbosacral region: Secondary | ICD-10-CM | POA: Diagnosis not present

## 2016-10-14 DIAGNOSIS — I10 Essential (primary) hypertension: Secondary | ICD-10-CM | POA: Diagnosis not present

## 2016-10-14 DIAGNOSIS — E663 Overweight: Secondary | ICD-10-CM | POA: Diagnosis not present

## 2016-10-20 DIAGNOSIS — E119 Type 2 diabetes mellitus without complications: Secondary | ICD-10-CM | POA: Diagnosis not present

## 2016-10-20 DIAGNOSIS — Z79899 Other long term (current) drug therapy: Secondary | ICD-10-CM | POA: Diagnosis not present

## 2016-10-20 DIAGNOSIS — E7800 Pure hypercholesterolemia, unspecified: Secondary | ICD-10-CM | POA: Diagnosis not present

## 2016-10-20 DIAGNOSIS — R079 Chest pain, unspecified: Secondary | ICD-10-CM | POA: Diagnosis not present

## 2016-10-20 DIAGNOSIS — Z7982 Long term (current) use of aspirin: Secondary | ICD-10-CM | POA: Diagnosis not present

## 2016-10-20 DIAGNOSIS — Z87891 Personal history of nicotine dependence: Secondary | ICD-10-CM | POA: Diagnosis not present

## 2016-10-20 DIAGNOSIS — Z955 Presence of coronary angioplasty implant and graft: Secondary | ICD-10-CM | POA: Diagnosis not present

## 2016-10-20 DIAGNOSIS — I2510 Atherosclerotic heart disease of native coronary artery without angina pectoris: Secondary | ICD-10-CM | POA: Diagnosis not present

## 2016-10-20 DIAGNOSIS — M25512 Pain in left shoulder: Secondary | ICD-10-CM | POA: Diagnosis not present

## 2016-10-20 DIAGNOSIS — R0789 Other chest pain: Secondary | ICD-10-CM | POA: Diagnosis not present

## 2016-10-21 DIAGNOSIS — R0789 Other chest pain: Secondary | ICD-10-CM | POA: Diagnosis not present

## 2016-10-21 DIAGNOSIS — R079 Chest pain, unspecified: Secondary | ICD-10-CM | POA: Diagnosis not present

## 2016-10-28 DIAGNOSIS — M5090 Cervical disc disorder, unspecified, unspecified cervical region: Secondary | ICD-10-CM | POA: Diagnosis not present

## 2016-10-28 DIAGNOSIS — I2510 Atherosclerotic heart disease of native coronary artery without angina pectoris: Secondary | ICD-10-CM | POA: Diagnosis not present

## 2016-10-28 DIAGNOSIS — E1165 Type 2 diabetes mellitus with hyperglycemia: Secondary | ICD-10-CM | POA: Diagnosis not present

## 2016-10-28 DIAGNOSIS — R071 Chest pain on breathing: Secondary | ICD-10-CM | POA: Diagnosis not present

## 2016-10-28 DIAGNOSIS — M25512 Pain in left shoulder: Secondary | ICD-10-CM | POA: Diagnosis not present

## 2017-01-27 DIAGNOSIS — R52 Pain, unspecified: Secondary | ICD-10-CM | POA: Diagnosis not present

## 2017-01-27 DIAGNOSIS — B9789 Other viral agents as the cause of diseases classified elsewhere: Secondary | ICD-10-CM | POA: Diagnosis not present

## 2017-01-27 DIAGNOSIS — J069 Acute upper respiratory infection, unspecified: Secondary | ICD-10-CM | POA: Diagnosis not present

## 2017-02-24 ENCOUNTER — Encounter: Payer: Self-pay | Admitting: Cardiology

## 2017-02-25 ENCOUNTER — Encounter: Payer: Self-pay | Admitting: Cardiology

## 2017-02-25 ENCOUNTER — Ambulatory Visit (INDEPENDENT_AMBULATORY_CARE_PROVIDER_SITE_OTHER): Payer: PPO | Admitting: Cardiology

## 2017-02-25 VITALS — BP 122/78 | HR 58 | Ht 68.0 in | Wt 185.1 lb

## 2017-02-25 DIAGNOSIS — E785 Hyperlipidemia, unspecified: Secondary | ICD-10-CM | POA: Diagnosis not present

## 2017-02-25 DIAGNOSIS — I25119 Atherosclerotic heart disease of native coronary artery with unspecified angina pectoris: Secondary | ICD-10-CM

## 2017-02-25 DIAGNOSIS — I493 Ventricular premature depolarization: Secondary | ICD-10-CM

## 2017-02-25 DIAGNOSIS — I10 Essential (primary) hypertension: Secondary | ICD-10-CM

## 2017-02-25 MED ORDER — METOPROLOL TARTRATE 50 MG PO TABS: 50.0000 mg | ORAL_TABLET | Freq: Two times a day (BID) | ORAL | 3 refills | Status: DC
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
## 2017-02-25 NOTE — Patient Instructions (Signed)
Medication Instructions:  Your physician recommends that you continue on your current medications as directed. Please refer to the Current Medication list given to you today.   Labwork: None  Testing/Procedures: EKG done today.  Follow-Up: Your physician wants you to follow-up in: 1 year You will receive a reminder letter in the mail two months in advance. If you don't receive a letter, please call our office to schedule the follow-up appointment.    Any Other Special Instructions Will Be Listed Below (If Applicable).     If you need a refill on your cardiac medications before your next appointment, please call your pharmacy.

## 2017-03-05 DIAGNOSIS — G4733 Obstructive sleep apnea (adult) (pediatric): Secondary | ICD-10-CM | POA: Diagnosis not present

## 2017-03-13 DIAGNOSIS — R972 Elevated prostate specific antigen [PSA]: Secondary | ICD-10-CM | POA: Diagnosis not present

## 2017-03-13 DIAGNOSIS — N401 Enlarged prostate with lower urinary tract symptoms: Secondary | ICD-10-CM | POA: Diagnosis not present

## 2017-04-17 DIAGNOSIS — M5442 Lumbago with sciatica, left side: Secondary | ICD-10-CM | POA: Insufficient documentation

## 2017-04-17 DIAGNOSIS — M545 Low back pain: Secondary | ICD-10-CM | POA: Diagnosis not present

## 2017-04-17 DIAGNOSIS — Z79899 Other long term (current) drug therapy: Secondary | ICD-10-CM | POA: Insufficient documentation

## 2017-04-17 DIAGNOSIS — Z5181 Encounter for therapeutic drug level monitoring: Secondary | ICD-10-CM | POA: Diagnosis not present

## 2017-04-17 DIAGNOSIS — G8929 Other chronic pain: Secondary | ICD-10-CM | POA: Insufficient documentation

## 2017-04-22 DIAGNOSIS — N401 Enlarged prostate with lower urinary tract symptoms: Secondary | ICD-10-CM | POA: Diagnosis not present

## 2017-04-22 DIAGNOSIS — R972 Elevated prostate specific antigen [PSA]: Secondary | ICD-10-CM | POA: Diagnosis not present

## 2017-06-10 DIAGNOSIS — E1165 Type 2 diabetes mellitus with hyperglycemia: Secondary | ICD-10-CM | POA: Diagnosis not present

## 2017-06-10 DIAGNOSIS — Z86010 Personal history of colonic polyps: Secondary | ICD-10-CM | POA: Diagnosis not present

## 2017-06-10 DIAGNOSIS — M5442 Lumbago with sciatica, left side: Secondary | ICD-10-CM | POA: Diagnosis not present

## 2017-06-10 DIAGNOSIS — Z1211 Encounter for screening for malignant neoplasm of colon: Secondary | ICD-10-CM | POA: Diagnosis not present

## 2017-06-10 DIAGNOSIS — R1310 Dysphagia, unspecified: Secondary | ICD-10-CM | POA: Diagnosis not present

## 2017-06-10 DIAGNOSIS — K2900 Acute gastritis without bleeding: Secondary | ICD-10-CM | POA: Diagnosis not present

## 2017-06-10 DIAGNOSIS — G8929 Other chronic pain: Secondary | ICD-10-CM | POA: Diagnosis not present

## 2017-06-17 DIAGNOSIS — M5442 Lumbago with sciatica, left side: Secondary | ICD-10-CM | POA: Diagnosis not present

## 2017-06-17 DIAGNOSIS — M47816 Spondylosis without myelopathy or radiculopathy, lumbar region: Secondary | ICD-10-CM | POA: Diagnosis not present

## 2017-06-17 DIAGNOSIS — M48061 Spinal stenosis, lumbar region without neurogenic claudication: Secondary | ICD-10-CM | POA: Diagnosis not present

## 2017-06-27 DIAGNOSIS — M4310 Spondylolisthesis, site unspecified: Secondary | ICD-10-CM | POA: Diagnosis not present

## 2017-07-02 DIAGNOSIS — R972 Elevated prostate specific antigen [PSA]: Secondary | ICD-10-CM | POA: Diagnosis not present

## 2017-07-02 DIAGNOSIS — N529 Male erectile dysfunction, unspecified: Secondary | ICD-10-CM | POA: Diagnosis not present

## 2017-07-21 DIAGNOSIS — M48062 Spinal stenosis, lumbar region with neurogenic claudication: Secondary | ICD-10-CM | POA: Diagnosis not present

## 2017-07-21 DIAGNOSIS — E119 Type 2 diabetes mellitus without complications: Secondary | ICD-10-CM | POA: Diagnosis not present

## 2017-07-23 DIAGNOSIS — I10 Essential (primary) hypertension: Secondary | ICD-10-CM | POA: Diagnosis not present

## 2017-07-23 DIAGNOSIS — M48062 Spinal stenosis, lumbar region with neurogenic claudication: Secondary | ICD-10-CM | POA: Diagnosis not present

## 2017-08-08 ENCOUNTER — Other Ambulatory Visit: Payer: Self-pay | Admitting: Cardiology

## 2017-08-22 DIAGNOSIS — M48062 Spinal stenosis, lumbar region with neurogenic claudication: Secondary | ICD-10-CM | POA: Diagnosis not present

## 2017-09-01 DIAGNOSIS — G8929 Other chronic pain: Secondary | ICD-10-CM | POA: Diagnosis not present

## 2017-09-01 DIAGNOSIS — M5442 Lumbago with sciatica, left side: Secondary | ICD-10-CM | POA: Diagnosis not present

## 2017-09-01 DIAGNOSIS — Z79899 Other long term (current) drug therapy: Secondary | ICD-10-CM | POA: Diagnosis not present

## 2017-09-01 DIAGNOSIS — G6189 Other inflammatory polyneuropathies: Secondary | ICD-10-CM | POA: Diagnosis not present

## 2017-11-19 ENCOUNTER — Other Ambulatory Visit: Payer: Self-pay | Admitting: Cardiology

## 2017-11-25 DIAGNOSIS — R5383 Other fatigue: Secondary | ICD-10-CM | POA: Diagnosis not present

## 2017-11-25 DIAGNOSIS — E039 Hypothyroidism, unspecified: Secondary | ICD-10-CM | POA: Diagnosis not present

## 2017-11-25 DIAGNOSIS — G6189 Other inflammatory polyneuropathies: Secondary | ICD-10-CM | POA: Diagnosis not present

## 2017-11-25 DIAGNOSIS — E1165 Type 2 diabetes mellitus with hyperglycemia: Secondary | ICD-10-CM | POA: Diagnosis not present

## 2017-11-25 DIAGNOSIS — E782 Mixed hyperlipidemia: Secondary | ICD-10-CM | POA: Diagnosis not present

## 2017-11-25 DIAGNOSIS — I10 Essential (primary) hypertension: Secondary | ICD-10-CM | POA: Diagnosis not present

## 2017-11-25 DIAGNOSIS — E538 Deficiency of other specified B group vitamins: Secondary | ICD-10-CM | POA: Diagnosis not present

## 2017-11-25 DIAGNOSIS — R5381 Other malaise: Secondary | ICD-10-CM | POA: Diagnosis not present

## 2017-11-25 DIAGNOSIS — Z79899 Other long term (current) drug therapy: Secondary | ICD-10-CM | POA: Diagnosis not present

## 2017-12-24 DIAGNOSIS — M48062 Spinal stenosis, lumbar region with neurogenic claudication: Secondary | ICD-10-CM | POA: Diagnosis not present

## 2017-12-24 DIAGNOSIS — Z6828 Body mass index (BMI) 28.0-28.9, adult: Secondary | ICD-10-CM | POA: Diagnosis not present

## 2017-12-24 DIAGNOSIS — I10 Essential (primary) hypertension: Secondary | ICD-10-CM | POA: Diagnosis not present

## 2017-12-26 DIAGNOSIS — G4733 Obstructive sleep apnea (adult) (pediatric): Secondary | ICD-10-CM | POA: Diagnosis not present

## 2018-02-05 DIAGNOSIS — M48062 Spinal stenosis, lumbar region with neurogenic claudication: Secondary | ICD-10-CM | POA: Diagnosis not present

## 2018-02-05 DIAGNOSIS — M4310 Spondylolisthesis, site unspecified: Secondary | ICD-10-CM | POA: Diagnosis not present

## 2018-02-05 DIAGNOSIS — E119 Type 2 diabetes mellitus without complications: Secondary | ICD-10-CM | POA: Diagnosis not present

## 2018-02-19 ENCOUNTER — Other Ambulatory Visit: Payer: Self-pay | Admitting: Neurosurgery

## 2018-02-19 DIAGNOSIS — M4310 Spondylolisthesis, site unspecified: Secondary | ICD-10-CM | POA: Diagnosis not present

## 2018-02-19 DIAGNOSIS — M48062 Spinal stenosis, lumbar region with neurogenic claudication: Secondary | ICD-10-CM | POA: Diagnosis not present

## 2018-02-23 DIAGNOSIS — L299 Pruritus, unspecified: Secondary | ICD-10-CM | POA: Diagnosis not present

## 2018-02-23 DIAGNOSIS — L821 Other seborrheic keratosis: Secondary | ICD-10-CM | POA: Diagnosis not present

## 2018-02-23 DIAGNOSIS — B029 Zoster without complications: Secondary | ICD-10-CM | POA: Diagnosis not present

## 2018-02-25 DIAGNOSIS — I10 Essential (primary) hypertension: Secondary | ICD-10-CM | POA: Diagnosis not present

## 2018-02-25 DIAGNOSIS — I25119 Atherosclerotic heart disease of native coronary artery with unspecified angina pectoris: Secondary | ICD-10-CM | POA: Diagnosis not present

## 2018-02-25 DIAGNOSIS — E1165 Type 2 diabetes mellitus with hyperglycemia: Secondary | ICD-10-CM | POA: Diagnosis not present

## 2018-02-25 DIAGNOSIS — E782 Mixed hyperlipidemia: Secondary | ICD-10-CM | POA: Diagnosis not present

## 2018-03-03 DIAGNOSIS — D1801 Hemangioma of skin and subcutaneous tissue: Secondary | ICD-10-CM | POA: Diagnosis not present

## 2018-03-03 DIAGNOSIS — D225 Melanocytic nevi of trunk: Secondary | ICD-10-CM | POA: Diagnosis not present

## 2018-03-03 DIAGNOSIS — D2239 Melanocytic nevi of other parts of face: Secondary | ICD-10-CM | POA: Diagnosis not present

## 2018-03-03 DIAGNOSIS — L821 Other seborrheic keratosis: Secondary | ICD-10-CM | POA: Diagnosis not present

## 2018-03-04 NOTE — Pre-Procedure Instructions (Signed)
Bryan Mcguire  03/04/2018      [place], [place] [address] Phone: [phone] Fax: [phone]    Your procedure is scheduled on Monday July 22.  Report to [facility] Admitting at 6:00 A.M.  Call this number if you have problems the morning of surgery:  [phone]   Remember:  Do not eat or drink after midnight.    Take these medicines the morning of surgery with A SIP OF WATER:   Metoprolol (Lopressor) Pantoprazole (protonix) Tamsulosin (flomax) Pregabalin (lyrica) Hydrocodone-acetaminophen (Norco) if needed  DO NOT TAKE metformin (Glucophage) the day of surgery  7 days prior to surgery STOP taking any Aspirin(unless otherwise instructed by your surgeon), Aleve, Naproxen, Ibuprofen, Motrin, Advil, Goody's, BC's, all herbal medications, fish oil, and all vitamins     How to Manage Your Diabetes Before and After Surgery  Why is it important to control my blood sugar before and after surgery? . Improving blood sugar levels before and after surgery helps healing and can limit problems. . A way of improving blood sugar control is eating a healthy diet by: o  Eating less sugar and carbohydrates o  Increasing activity/exercise o  Talking with your doctor about reaching your blood sugar goals . High blood sugars (greater than 180 mg/dL) can raise your risk of infections and slow your recovery, so you will need to focus on controlling your diabetes during the weeks before surgery. . Make sure that the doctor who takes care of your diabetes knows about your planned surgery including the date and location.  How do I manage my blood sugar before surgery? . Check your blood sugar at least 4 times a day, starting 2 days before surgery, to make sure that the level is not too high or low. o Check your blood sugar the morning of your surgery when you wake up and every 2 hours until you get to the Short Stay  unit. . If your blood sugar is less than 70 mg/dL, you will need to treat for low blood sugar: o Do not take insulin. o Treat a low blood sugar (less than 70 mg/dL) with  cup of clear juice (cranberry or apple), 4 glucose tablets, OR glucose gel. Recheck blood sugar in 15 minutes after treatment (to make sure it is greater than 70 mg/dL). If your blood sugar is not greater than 70 mg/dL on recheck, call [phone] o  for further instructions. . Report your blood sugar to the short stay nurse when you get to Short Stay.  . If you are admitted to the hospital after surgery: o Your blood sugar will be checked by the staff and you will probably be given insulin after surgery (instead of oral diabetes medicines) to make sure you have good blood sugar levels. o The goal for blood sugar control after surgery is 80-180 mg/dL.               Do not wear jewelry, make-up or nail polish.  Do not wear lotions, powders, or perfumes, or deodorant.  Do not shave 48 hours prior to surgery.  Men may shave face and neck.  Do not bring valuables to the hospital.  [facility] is not responsible for any belongings or valuables.  Contacts, dentures or bridgework may not be worn into surgery.  Leave your suitcase in the car.  After surgery it may be brought to your room.  For patients admitted to the hospital, discharge time will be determined by your treatment team.  Patients discharged the day of surgery will not be allowed to drive home.   Special instructions:    [facility]- Preparing For Surgery  Before surgery, you can play an important role. Because skin is not sterile, your skin needs to be as free of germs as possible. You can reduce the number of germs on your skin by washing with CHG (chlorahexidine gluconate) Soap before surgery.  CHG is an antiseptic cleaner which kills germs and bonds with the skin to continue killing germs even after washing.    Oral Hygiene is also important to  reduce your risk of infection.  Remember - BRUSH YOUR TEETH THE MORNING OF SURGERY WITH YOUR REGULAR TOOTHPASTE  Please do not use if you have an allergy to CHG or antibacterial soaps. If your skin becomes reddened/irritated stop using the CHG.  Do not shave (including legs and underarms) for at least 48 hours prior to first CHG shower. It is OK to shave your face.  Please follow these instructions carefully.   1. Shower the NIGHT BEFORE SURGERY and the MORNING OF SURGERY with CHG.   2. If you chose to wash your hair, wash your hair first as usual with your normal shampoo.  3. After you shampoo, rinse your hair and body thoroughly to remove the shampoo.  4. Use CHG as you would any other liquid soap. You can apply CHG directly to the skin and wash gently with a scrungie or a clean washcloth.   5. Apply the CHG Soap to your body ONLY FROM THE NECK DOWN.  Do not use on open wounds or open sores. Avoid contact with your eyes, ears, mouth and genitals (private parts). Wash Face and genitals (private parts)  with your normal soap.  6. Wash thoroughly, paying special attention to the area where your surgery will be performed.  7. Thoroughly rinse your body with warm water from the neck down.  8. DO NOT shower/wash with your normal soap after using and rinsing off the CHG Soap.  9. Pat yourself dry with a CLEAN TOWEL.  10. Wear CLEAN PAJAMAS to bed the night before surgery, wear comfortable clothes the morning of surgery  11. Place CLEAN SHEETS on your bed the night of your first shower and DO NOT SLEEP WITH PETS.    Day of Surgery:  Do not apply any deodorants/lotions.  Please wear clean clothes to the hospital/surgery center.   Remember to brush your teeth WITH YOUR REGULAR TOOTHPASTE.    Please read over the following fact sheets that you were given. Coughing and Deep Breathing, MRSA Information and Surgical Site Infection Prevention

## 2018-03-05 ENCOUNTER — Inpatient Hospital Stay (HOSPITAL_COMMUNITY)
Admission: RE | Admit: 2018-03-05 | Discharge: 2018-03-05 | Disposition: A | Discharge status: Home / Self Care | Payer: PPO | Source: Ambulatory Visit

## 2018-03-12 ENCOUNTER — Encounter (HOSPITAL_COMMUNITY): Admission: RE | Admit: 2018-03-12 | Payer: PPO | Source: Ambulatory Visit

## 2018-03-16 ENCOUNTER — Inpatient Hospital Stay (HOSPITAL_COMMUNITY): Admission: RE | Admit: 2018-03-16 | Payer: PPO | Source: Ambulatory Visit | Admitting: Neurosurgery

## 2018-03-16 ENCOUNTER — Encounter (HOSPITAL_COMMUNITY): Admission: RE | Payer: Self-pay | Source: Ambulatory Visit

## 2018-03-16 SURGERY — ANTERIOR LATERAL LUMBAR FUSION WITH PERCUTANEOUS SCREW 3 LEVEL
Anesthesia: General | Laterality: Left

## 2018-03-18 ENCOUNTER — Other Ambulatory Visit: Payer: Self-pay | Admitting: Neurosurgery

## 2018-03-19 ENCOUNTER — Encounter (HOSPITAL_COMMUNITY): Payer: Self-pay | Admitting: *Deleted

## 2018-03-19 ENCOUNTER — Other Ambulatory Visit: Payer: Self-pay

## 2018-03-19 NOTE — Progress Notes (Addendum)
Bryan Mcguire reports that CBG's run 130'5 - 1 60's.  {atient checks CBG's every other day , fasting runs around 150.  I instructed patient to not take Metformin in am. I instructed patient to check CBG after awaking and every 2 hours until arrival  to the hospital.  I Instructed patient if CBG is less than 70 to drink 1/2 cup of a clear juice. Recheck CBG in 15 minutes then call pre- op desk at [phone] for further instructions.   Bryan Mcguire denies chest pain or shortness of breath.  Patient is a patient of Dr Bettina Gavia, last office visit was 02/2017.  Bryan Mcguire states that he will be seeing him as soon as he get an appointment after surgery.  Bryan Mcguire states that he  Spoke with someone at the office and informed them that he had stopped Aspirin as of 03/18/18 for surgery. "They always said it is ok."  Bryan Mcguire was treated for shingles 7/3/129, patient states that it is on his back.  Patient  was seen by Dr Jimmye Norman in [place] and was started on Valtrex.  Bryan Mcguire states that he followed up with Dr Jimmye Norman last week and  Valtrex was stopped and he is just on a cream for itching.  I sent a request for records to Dr Jimmye Norman office. Bryan Mcguire states that he does not have any drainage from the area and itching has diminished.
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
## 2018-03-20 ENCOUNTER — Inpatient Hospital Stay (HOSPITAL_COMMUNITY): Payer: PPO

## 2018-03-20 ENCOUNTER — Inpatient Hospital Stay (HOSPITAL_COMMUNITY): Payer: PPO | Admitting: Vascular Surgery

## 2018-03-20 ENCOUNTER — Encounter (HOSPITAL_COMMUNITY)
Admission: RE | Disposition: A | Discharge status: Home / Self Care | Payer: Self-pay | Source: Home / Self Care | Attending: Neurosurgery

## 2018-03-20 ENCOUNTER — Inpatient Hospital Stay (HOSPITAL_COMMUNITY)
Admission: RE | Admit: 2018-03-20 | Discharge: 2018-03-22 | DRG: 460 | Disposition: A | Discharge status: Home / Self Care | Payer: PPO | Attending: Neurosurgery | Admitting: Neurosurgery

## 2018-03-20 DIAGNOSIS — Z79899 Other long term (current) drug therapy: Secondary | ICD-10-CM

## 2018-03-20 DIAGNOSIS — E1142 Type 2 diabetes mellitus with diabetic polyneuropathy: Secondary | ICD-10-CM | POA: Diagnosis present

## 2018-03-20 DIAGNOSIS — I2510 Atherosclerotic heart disease of native coronary artery without angina pectoris: Secondary | ICD-10-CM | POA: Diagnosis present

## 2018-03-20 DIAGNOSIS — F419 Anxiety disorder, unspecified: Secondary | ICD-10-CM | POA: Diagnosis not present

## 2018-03-20 DIAGNOSIS — Z955 Presence of coronary angioplasty implant and graft: Secondary | ICD-10-CM | POA: Diagnosis not present

## 2018-03-20 DIAGNOSIS — Z7982 Long term (current) use of aspirin: Secondary | ICD-10-CM | POA: Diagnosis not present

## 2018-03-20 DIAGNOSIS — K219 Gastro-esophageal reflux disease without esophagitis: Secondary | ICD-10-CM | POA: Diagnosis not present

## 2018-03-20 DIAGNOSIS — Z419 Encounter for procedure for purposes other than remedying health state, unspecified: Secondary | ICD-10-CM

## 2018-03-20 DIAGNOSIS — M4316 Spondylolisthesis, lumbar region: Secondary | ICD-10-CM | POA: Diagnosis not present

## 2018-03-20 DIAGNOSIS — Z7984 Long term (current) use of oral hypoglycemic drugs: Secondary | ICD-10-CM | POA: Diagnosis not present

## 2018-03-20 DIAGNOSIS — M48062 Spinal stenosis, lumbar region with neurogenic claudication: Secondary | ICD-10-CM | POA: Diagnosis not present

## 2018-03-20 DIAGNOSIS — Z87891 Personal history of nicotine dependence: Secondary | ICD-10-CM

## 2018-03-20 DIAGNOSIS — Z981 Arthrodesis status: Secondary | ICD-10-CM | POA: Diagnosis not present

## 2018-03-20 DIAGNOSIS — I10 Essential (primary) hypertension: Secondary | ICD-10-CM | POA: Diagnosis not present

## 2018-03-20 DIAGNOSIS — M4310 Spondylolisthesis, site unspecified: Secondary | ICD-10-CM | POA: Diagnosis not present

## 2018-03-20 DIAGNOSIS — M5136 Other intervertebral disc degeneration, lumbar region: Principal | ICD-10-CM | POA: Diagnosis present

## 2018-03-20 DIAGNOSIS — G4733 Obstructive sleep apnea (adult) (pediatric): Secondary | ICD-10-CM | POA: Diagnosis not present

## 2018-03-20 DIAGNOSIS — M549 Dorsalgia, unspecified: Secondary | ICD-10-CM | POA: Diagnosis present

## 2018-03-20 DIAGNOSIS — M4326 Fusion of spine, lumbar region: Secondary | ICD-10-CM | POA: Diagnosis not present

## 2018-03-20 DIAGNOSIS — I252 Old myocardial infarction: Secondary | ICD-10-CM | POA: Diagnosis not present

## 2018-03-20 HISTORY — DX: Anxiety disorder, unspecified: F41.9

## 2018-03-20 HISTORY — PX: ANTERIOR LATERAL LUMBAR FUSION WITH PERCUTANEOUS SCREW 3 LEVEL: SHX5555

## 2018-03-20 HISTORY — DX: Type 2 diabetes mellitus without complications: E11.9

## 2018-03-20 LAB — TYPE AND SCREEN
ABO/RH(D): A NEG
ANTIBODY SCREEN: NEGATIVE

## 2018-03-20 LAB — BASIC METABOLIC PANEL
ANION GAP: 9 (ref 5–15)
BUN: 16 mg/dL (ref 8–23)
CALCIUM: 9.8 mg/dL (ref 8.9–10.3)
CHLORIDE: 102 mmol/L (ref 98–111)
CO2: 27 mmol/L (ref 22–32)
CREATININE: 1.21 mg/dL (ref 0.61–1.24)
GFR calc Af Amer: >60 mL/min (ref >60)
GFR calc non Af Amer: 60 mL/min — ABNORMAL LOW (ref >60)
GLUCOSE: 186 mg/dL — AB (ref 70–99)
Potassium: 5.2 mmol/L — ABNORMAL HIGH (ref 3.5–5.1)
Sodium: 138 mmol/L (ref 135–145)

## 2018-03-20 LAB — GLUCOSE, CAPILLARY
GLUCOSE-CAPILLARY: 238 mg/dL — AB (ref 70–99)
Glucose-Capillary: 174 mg/dL — ABNORMAL HIGH (ref 70–99)
Glucose-Capillary: 184 mg/dL — ABNORMAL HIGH (ref 70–99)

## 2018-03-20 LAB — CBC WITH DIFFERENTIAL/PLATELET
ABS IMMATURE GRANULOCYTES: 0.1 10*3/uL (ref 0.0–0.1)
BASOS PCT: 1 %
Basophils Absolute: 0 10*3/uL (ref 0.0–0.1)
Eosinophils Absolute: 0.1 10*3/uL (ref 0.0–0.7)
Eosinophils Relative: 2 %
HEMATOCRIT: 41.6 % (ref 39.0–52.0)
HEMOGLOBIN: 13.9 g/dL (ref 13.0–17.0)
IMMATURE GRANULOCYTES: 1 %
LYMPHS ABS: 1.5 10*3/uL (ref 0.7–4.0)
Lymphocytes Relative: 22 %
MCH: 32.3 pg (ref 26.0–34.0)
MCHC: 33.4 g/dL (ref 30.0–36.0)
MCV: 96.7 fL (ref 78.0–100.0)
MONO ABS: 0.7 10*3/uL (ref 0.1–1.0)
MONOS PCT: 10 %
NEUTROS ABS: 4.4 10*3/uL (ref 1.7–7.7)
Neutrophils Relative %: 64 %
PLATELETS: 243 10*3/uL (ref 150–400)
RBC: 4.3 MIL/uL (ref 4.22–5.81)
RDW: 12.6 % (ref 11.5–15.5)
WBC: 6.8 10*3/uL (ref 4.0–10.5)

## 2018-03-20 SURGERY — ANTERIOR LATERAL LUMBAR FUSION WITH PERCUTANEOUS SCREW 3 LEVEL
Anesthesia: General | Laterality: Left

## 2018-03-20 MED ORDER — ALPRAZOLAM 0.5 MG PO TABS: 0.5000 mg | ORAL_TABLET | Freq: Every evening | ORAL | Status: DC | PRN

## 2018-03-20 MED ORDER — CEFAZOLIN SODIUM-DEXTROSE 2-4 GM/100ML-% IV SOLN
INTRAVENOUS | Status: AC
  Filled 2018-03-20: qty 100

## 2018-03-20 MED ORDER — INSULIN ASPART 100 UNIT/ML ~~LOC~~ SOLN
0.0000 [IU] | Freq: Three times a day (TID) | SUBCUTANEOUS | Status: DC
  Administered 2018-03-21 (×2): 5 [IU] via SUBCUTANEOUS
  Administered 2018-03-21: 3 [IU] via SUBCUTANEOUS
  Administered 2018-03-22: 5 [IU] via SUBCUTANEOUS

## 2018-03-20 MED ORDER — MENTHOL 3 MG MT LOZG: 1.0000 | LOZENGE | OROMUCOSAL | Status: DC | PRN

## 2018-03-20 MED ORDER — OXYCODONE HCL 5 MG/5ML PO SOLN: 5.0000 mg | Freq: Once | ORAL | Status: DC | PRN

## 2018-03-20 MED ORDER — LIDOCAINE 2% (20 MG/ML) 5 ML SYRINGE
INTRAMUSCULAR | Status: DC | PRN
  Administered 2018-03-20: 80 mg via INTRAVENOUS

## 2018-03-20 MED ORDER — FENTANYL CITRATE (PF) 250 MCG/5ML IJ SOLN
INTRAMUSCULAR | Status: AC
  Filled 2018-03-20: qty 5

## 2018-03-20 MED ORDER — FENTANYL CITRATE (PF) 100 MCG/2ML IJ SOLN
INTRAMUSCULAR | Status: AC
  Filled 2018-03-20: qty 2

## 2018-03-20 MED ORDER — MEPERIDINE HCL 50 MG/ML IJ SOLN
6.2500 mg | INTRAMUSCULAR | Status: DC | PRN
  Administered 2018-03-20 (×2): 12.5 mg via INTRAVENOUS

## 2018-03-20 MED ORDER — MIDAZOLAM HCL 5 MG/5ML IJ SOLN
INTRAMUSCULAR | Status: DC | PRN
  Administered 2018-03-20: 2 mg via INTRAVENOUS

## 2018-03-20 MED ORDER — ONDANSETRON HCL 4 MG PO TABS: 4.0000 mg | ORAL_TABLET | Freq: Four times a day (QID) | ORAL | Status: DC | PRN

## 2018-03-20 MED ORDER — ACETAMINOPHEN 650 MG RE SUPP: 650.0000 mg | RECTAL | Status: DC | PRN

## 2018-03-20 MED ORDER — PROPOFOL 500 MG/50ML IV EMUL
INTRAVENOUS | Status: DC | PRN
  Administered 2018-03-20: 90 ug/kg/min via INTRAVENOUS

## 2018-03-20 MED ORDER — ACETAMINOPHEN 160 MG/5ML PO SOLN: 325.0000 mg | ORAL | Status: DC | PRN

## 2018-03-20 MED ORDER — ACETAMINOPHEN 325 MG PO TABS: 325.0000 mg | ORAL_TABLET | ORAL | Status: DC | PRN

## 2018-03-20 MED ORDER — PREGABALIN 75 MG PO CAPS
75.0000 mg | ORAL_CAPSULE | Freq: Two times a day (BID) | ORAL | Status: DC
  Administered 2018-03-20 – 2018-03-22 (×4): 75 mg via ORAL
  Filled 2018-03-20 (×4): qty 1

## 2018-03-20 MED ORDER — HYDROMORPHONE HCL 1 MG/ML IJ SOLN
1.0000 mg | INTRAMUSCULAR | Status: DC | PRN
  Administered 2018-03-20 – 2018-03-21 (×6): 1 mg via INTRAVENOUS
  Filled 2018-03-20 (×6): qty 1

## 2018-03-20 MED ORDER — MEPERIDINE HCL 50 MG/ML IJ SOLN
INTRAMUSCULAR | Status: AC
  Filled 2018-03-20: qty 1

## 2018-03-20 MED ORDER — OXYCODONE HCL 5 MG PO TABS: 5.0000 mg | ORAL_TABLET | Freq: Once | ORAL | Status: DC | PRN

## 2018-03-20 MED ORDER — METFORMIN HCL ER 500 MG PO TB24
500.0000 mg | ORAL_TABLET | Freq: Two times a day (BID) | ORAL | Status: DC
  Administered 2018-03-20 – 2018-03-22 (×4): 500 mg via ORAL
  Filled 2018-03-20 (×4): qty 1

## 2018-03-20 MED ORDER — VITAMIN D (ERGOCALCIFEROL) 1.25 MG (50000 UNIT) PO CAPS: 50000.0000 [IU] | ORAL_CAPSULE | ORAL | Status: DC

## 2018-03-20 MED ORDER — PANTOPRAZOLE SODIUM 40 MG PO TBEC
40.0000 mg | DELAYED_RELEASE_TABLET | Freq: Every day | ORAL | Status: DC
  Administered 2018-03-21 – 2018-03-22 (×2): 40 mg via ORAL
  Filled 2018-03-20 (×2): qty 1

## 2018-03-20 MED ORDER — SODIUM CHLORIDE 0.9% FLUSH
3.0000 mL | INTRAVENOUS | Status: DC | PRN
Start: 2018-03-20 — End: 2018-03-22

## 2018-03-20 MED ORDER — PROPOFOL 10 MG/ML IV BOLUS
INTRAVENOUS | Status: AC
  Filled 2018-03-20: qty 20

## 2018-03-20 MED ORDER — FENTANYL CITRATE (PF) 100 MCG/2ML IJ SOLN
INTRAMUSCULAR | Status: DC | PRN
  Administered 2018-03-20: 50 ug via INTRAVENOUS
  Administered 2018-03-20: 25 ug via INTRAVENOUS
  Administered 2018-03-20 (×4): 50 ug via INTRAVENOUS

## 2018-03-20 MED ORDER — ZOLPIDEM TARTRATE 5 MG PO TABS
10.0000 mg | ORAL_TABLET | Freq: Every day | ORAL | Status: DC
  Administered 2018-03-20 – 2018-03-21 (×2): 10 mg via ORAL
  Filled 2018-03-20 (×2): qty 2

## 2018-03-20 MED ORDER — DEXAMETHASONE SODIUM PHOSPHATE 10 MG/ML IJ SOLN
INTRAMUSCULAR | Status: AC
  Filled 2018-03-20: qty 1

## 2018-03-20 MED ORDER — ACETAMINOPHEN 325 MG PO TABS: 650.0000 mg | ORAL_TABLET | ORAL | Status: DC | PRN

## 2018-03-20 MED ORDER — PHENOL 1.4 % MT LIQD: 1.0000 | OROMUCOSAL | Status: DC | PRN

## 2018-03-20 MED ORDER — ONDANSETRON HCL 4 MG/2ML IJ SOLN
4.0000 mg | Freq: Four times a day (QID) | INTRAMUSCULAR | Status: DC | PRN
  Administered 2018-03-21: 4 mg via INTRAVENOUS
  Filled 2018-03-20: qty 2

## 2018-03-20 MED ORDER — CEFAZOLIN SODIUM-DEXTROSE 1-4 GM/50ML-% IV SOLN
1.0000 g | Freq: Three times a day (TID) | INTRAVENOUS | Status: AC
  Administered 2018-03-20 – 2018-03-21 (×2): 1 g via INTRAVENOUS
  Filled 2018-03-20 (×2): qty 50

## 2018-03-20 MED ORDER — BUPIVACAINE HCL (PF) 0.25 % IJ SOLN
INTRAMUSCULAR | Status: AC
  Filled 2018-03-20: qty 30

## 2018-03-20 MED ORDER — PRAVASTATIN SODIUM 20 MG PO TABS
10.0000 mg | ORAL_TABLET | Freq: Every day | ORAL | Status: DC
  Administered 2018-03-20 – 2018-03-22 (×3): 10 mg via ORAL
  Filled 2018-03-20 (×3): qty 1

## 2018-03-20 MED ORDER — 0.9 % SODIUM CHLORIDE (POUR BTL) OPTIME
TOPICAL | Status: DC | PRN
  Administered 2018-03-20: 1000 mL

## 2018-03-20 MED ORDER — DEXAMETHASONE SODIUM PHOSPHATE 10 MG/ML IJ SOLN
10.0000 mg | INTRAMUSCULAR | Status: AC
  Administered 2018-03-20: 10 mg via INTRAVENOUS

## 2018-03-20 MED ORDER — TAMSULOSIN HCL 0.4 MG PO CAPS
0.4000 mg | ORAL_CAPSULE | Freq: Every day | ORAL | Status: DC
  Administered 2018-03-20 – 2018-03-22 (×3): 0.4 mg via ORAL
  Filled 2018-03-20 (×3): qty 1

## 2018-03-20 MED ORDER — FLEET ENEMA 7-19 GM/118ML RE ENEM: 1.0000 | ENEMA | Freq: Once | RECTAL | Status: DC | PRN

## 2018-03-20 MED ORDER — CEFAZOLIN SODIUM-DEXTROSE 2-4 GM/100ML-% IV SOLN
2.0000 g | INTRAVENOUS | Status: AC
  Administered 2018-03-20: 2 g via INTRAVENOUS

## 2018-03-20 MED ORDER — BISACODYL 10 MG RE SUPP: 10.0000 mg | Freq: Every day | RECTAL | Status: DC | PRN

## 2018-03-20 MED ORDER — MIDAZOLAM HCL 2 MG/2ML IJ SOLN
INTRAMUSCULAR | Status: AC
  Filled 2018-03-20: qty 2

## 2018-03-20 MED ORDER — THROMBIN 5000 UNITS EX SOLR
CUTANEOUS | Status: AC
  Filled 2018-03-20: qty 5000

## 2018-03-20 MED ORDER — BACITRACIN 50000 UNITS IM SOLR
INTRAMUSCULAR | Status: DC | PRN
  Administered 2018-03-20: 13:00:00

## 2018-03-20 MED ORDER — LACTATED RINGERS IV SOLN
INTRAVENOUS | Status: DC
  Administered 2018-03-20 (×2): via INTRAVENOUS

## 2018-03-20 MED ORDER — ONDANSETRON HCL 4 MG/2ML IJ SOLN
INTRAMUSCULAR | Status: DC | PRN
  Administered 2018-03-20: 4 mg via INTRAVENOUS

## 2018-03-20 MED ORDER — POLYETHYLENE GLYCOL 3350 17 G PO PACK: 17.0000 g | PACK | Freq: Every day | ORAL | Status: DC | PRN

## 2018-03-20 MED ORDER — OXYCODONE HCL 5 MG PO TABS
10.0000 mg | ORAL_TABLET | ORAL | Status: DC | PRN
  Administered 2018-03-20 – 2018-03-22 (×6): 10 mg via ORAL
  Filled 2018-03-20 (×6): qty 2

## 2018-03-20 MED ORDER — ONDANSETRON HCL 4 MG/2ML IJ SOLN: 4.0000 mg | Freq: Once | INTRAMUSCULAR | Status: DC | PRN

## 2018-03-20 MED ORDER — PROPOFOL 10 MG/ML IV BOLUS
INTRAVENOUS | Status: DC | PRN
  Administered 2018-03-20: 150 mg via INTRAVENOUS
  Administered 2018-03-20: 30 mg via INTRAVENOUS

## 2018-03-20 MED ORDER — SUCCINYLCHOLINE CHLORIDE 20 MG/ML IJ SOLN
INTRAMUSCULAR | Status: DC | PRN
  Administered 2018-03-20: 100 mg via INTRAVENOUS

## 2018-03-20 MED ORDER — METOPROLOL TARTRATE 50 MG PO TABS
50.0000 mg | ORAL_TABLET | Freq: Two times a day (BID) | ORAL | Status: DC
  Administered 2018-03-20 – 2018-03-22 (×4): 50 mg via ORAL
  Filled 2018-03-20 (×4): qty 1

## 2018-03-20 MED ORDER — DIAZEPAM 5 MG PO TABS
5.0000 mg | ORAL_TABLET | Freq: Four times a day (QID) | ORAL | Status: DC | PRN
  Administered 2018-03-20: 5 mg via ORAL
  Filled 2018-03-20: qty 1

## 2018-03-20 MED ORDER — SODIUM CHLORIDE 0.9% FLUSH
3.0000 mL | Freq: Two times a day (BID) | INTRAVENOUS | Status: DC
  Administered 2018-03-21 – 2018-03-22 (×4): 3 mL via INTRAVENOUS

## 2018-03-20 MED ORDER — SODIUM CHLORIDE 0.9 % IV SOLN
INTRAVENOUS | Status: DC | PRN
  Administered 2018-03-20: 20 ug/min via INTRAVENOUS

## 2018-03-20 MED ORDER — SODIUM CHLORIDE 0.9 % IV SOLN
250.0000 mL | INTRAVENOUS | Status: DC
  Administered 2018-03-20: 1000 mL via INTRAVENOUS

## 2018-03-20 MED ORDER — FENTANYL CITRATE (PF) 100 MCG/2ML IJ SOLN
25.0000 ug | INTRAMUSCULAR | Status: DC | PRN
  Administered 2018-03-20 (×3): 50 ug via INTRAVENOUS

## 2018-03-20 MED ORDER — HYDROCODONE-ACETAMINOPHEN 10-325 MG PO TABS
1.0000 | ORAL_TABLET | ORAL | Status: DC | PRN
  Administered 2018-03-21 – 2018-03-22 (×2): 1 via ORAL
  Filled 2018-03-20 (×2): qty 1

## 2018-03-20 SURGICAL SUPPLY — 62 items
BAG DECANTER FOR FLEXI CONT (MISCELLANEOUS) ×3 IMPLANT
BENZOIN TINCTURE PRP APPL 2/3 (GAUZE/BANDAGES/DRESSINGS) ×3 IMPLANT
BLADE CLIPPER SURG (BLADE) IMPLANT
BONE MATRIX OSTEOCEL PRO MED (Bone Implant) ×9 IMPLANT
CAGE MODULUS XL 10X18X60 - 10 (Cage) ×3 IMPLANT
CARTRIDGE OIL MAESTRO DRILL (MISCELLANEOUS) ×1 IMPLANT
CLOSURE WOUND 1/2 X4 (GAUZE/BANDAGES/DRESSINGS) ×1
COVER BACK TABLE 24X17X13 BIG (DRAPES) IMPLANT
DERMABOND ADVANCED (GAUZE/BANDAGES/DRESSINGS) ×2
DERMABOND ADVANCED .7 DNX12 (GAUZE/BANDAGES/DRESSINGS) ×1 IMPLANT
DIFFUSER DRILL AIR PNEUMATIC (MISCELLANEOUS) ×3 IMPLANT
DRAPE C-ARM 42X72 X-RAY (DRAPES) ×3 IMPLANT
DRAPE C-ARMOR (DRAPES) ×3 IMPLANT
DRAPE LAPAROTOMY 100X72X124 (DRAPES) ×3 IMPLANT
DRAPE POUCH INSTRU U-SHP 10X18 (DRAPES) ×3 IMPLANT
DRAPE SURG 17X23 STRL (DRAPES) ×6 IMPLANT
DRSG OPSITE POSTOP 4X8 (GAUZE/BANDAGES/DRESSINGS) ×6 IMPLANT
ELECT REM PT RETURN 9FT ADLT (ELECTROSURGICAL) ×3
ELECTRODE REM PT RTRN 9FT ADLT (ELECTROSURGICAL) ×1 IMPLANT
GAUZE SPONGE 4X4 16PLY XRAY LF (GAUZE/BANDAGES/DRESSINGS) ×3 IMPLANT
GLOVE BIO SURGEON STRL SZ8 (GLOVE) ×3 IMPLANT
GLOVE BIOGEL PI IND STRL 6.5 (GLOVE) ×1 IMPLANT
GLOVE BIOGEL PI INDICATOR 6.5 (GLOVE) ×2
GLOVE ECLIPSE 7.5 STRL STRAW (GLOVE) ×12 IMPLANT
GLOVE ECLIPSE 9.0 STRL (GLOVE) ×6 IMPLANT
GLOVE EXAM NITRILE LRG STRL (GLOVE) IMPLANT
GLOVE EXAM NITRILE XL STR (GLOVE) IMPLANT
GLOVE EXAM NITRILE XS STR PU (GLOVE) IMPLANT
GLOVE INDICATOR 8.5 STRL (GLOVE) ×3 IMPLANT
GLOVE SURG SS PI 6.5 STRL IVOR (GLOVE) ×3 IMPLANT
GLOVE SURG SS PI 7.5 STRL IVOR (GLOVE) ×3 IMPLANT
GOWN STRL REUS W/ TWL LRG LVL3 (GOWN DISPOSABLE) ×1 IMPLANT
GOWN STRL REUS W/ TWL XL LVL3 (GOWN DISPOSABLE) ×3 IMPLANT
GOWN STRL REUS W/TWL 2XL LVL3 (GOWN DISPOSABLE) ×3 IMPLANT
GOWN STRL REUS W/TWL LRG LVL3 (GOWN DISPOSABLE) ×2
GOWN STRL REUS W/TWL XL LVL3 (GOWN DISPOSABLE) ×6
GUIDEWIRE NITINOL BEVEL TIP (WIRE) ×12 IMPLANT
KIT BASIN OR (CUSTOM PROCEDURE TRAY) ×3 IMPLANT
KIT DILATOR XLIF 5 (KITS) ×4 IMPLANT
KIT SURGICAL ACCESS MAXCESS 4 (KITS) ×6 IMPLANT
KIT TURNOVER KIT B (KITS) ×3 IMPLANT
KIT XLIF (KITS) ×2
MODULE EMG NEEDLE SSEP NVM5 (NEEDLE) ×3 IMPLANT
MODULE NVM5 NEXT GEN EMG (NEEDLE) ×3 IMPLANT
MODULUS XLW 12X22X55MM 10 (Spine Construct) ×6 IMPLANT
NEEDLE HYPO 22GX1.5 SAFETY (NEEDLE) ×3 IMPLANT
NEEDLE I-PASS III (NEEDLE) ×3 IMPLANT
NS IRRIG 1000ML POUR BTL (IV SOLUTION) ×3 IMPLANT
OIL CARTRIDGE MAESTRO DRILL (MISCELLANEOUS) ×3
PACK LAMINECTOMY NEURO (CUSTOM PROCEDURE TRAY) ×3 IMPLANT
ROD RELINE MAS LORD 5.5X120MM (Rod) ×3 IMPLANT
SCREW LOCK RELINE 5.5 TULIP (Screw) ×12 IMPLANT
SCREW MAS RELINE 6.5X45 POLY (Screw) ×12 IMPLANT
SPONGE LAP 4X18 RFD (DISPOSABLE) IMPLANT
STAPLER VISISTAT 35W (STAPLE) ×3 IMPLANT
STRIP CLOSURE SKIN 1/2X4 (GAUZE/BANDAGES/DRESSINGS) ×2 IMPLANT
SUT VIC AB 2-0 CT1 18 (SUTURE) ×6 IMPLANT
SUT VIC AB 3-0 SH 8-18 (SUTURE) ×6 IMPLANT
TOWEL GREEN STERILE (TOWEL DISPOSABLE) ×3 IMPLANT
TOWEL GREEN STERILE FF (TOWEL DISPOSABLE) ×3 IMPLANT
TRAY FOLEY MTR SLVR 16FR STAT (SET/KITS/TRAYS/PACK) ×3 IMPLANT
WATER STERILE IRR 1000ML POUR (IV SOLUTION) ×3 IMPLANT

## 2018-03-20 NOTE — Anesthesia Preprocedure Evaluation (Signed)
Anesthesia Evaluation  Patient identified by MRN, date of birth, ID band Patient awake    Reviewed: Allergy & Precautions, H&P , NPO status , Patient's Chart, lab work & pertinent test results, reviewed documented beta blocker date and time   Airway Mallampati: III  TM Distance: >3 FB Neck ROM: Full    Dental no notable dental hx. (+) Upper Dentures, Dental Advisory Given   Pulmonary former smoker,    Pulmonary exam normal breath sounds clear to auscultation       Cardiovascular hypertension, On Medications and On Home Beta Blockers + CAD, + Past MI and + Cardiac Stents   Rhythm:Regular Rate:Normal     Neuro/Psych negative neurological ROS  negative psych ROS   GI/Hepatic GERD  Medicated and Controlled,  Endo/Other  negative endocrine ROSdiabetes, Oral Hypoglycemic Agents  Renal/GU   negative genitourinary   Musculoskeletal  (+) Arthritis , Osteoarthritis,    Abdominal   Peds  Hematology   Anesthesia Other Findings   Reproductive/Obstetrics negative OB ROS                             Anesthesia Physical  Anesthesia Plan  ASA: III  Anesthesia Plan: General   Post-op Pain Management:    Induction: Intravenous  PONV Risk Score and Plan: 2 and Ondansetron and Treatment may vary due to age or medical condition  Airway Management Planned: Oral ETT  Additional Equipment:   Intra-op Plan:   Post-operative Plan: Extubation in OR  Informed Consent: I have reviewed the patients History and Physical, chart, labs and discussed the procedure including the risks, benefits and alternatives for the proposed anesthesia with the patient or authorized representative who has indicated his/her understanding and acceptance.   Dental advisory given  Plan Discussed with: CRNA, Anesthesiologist and Surgeon  Anesthesia Plan Comments: (  )        Anesthesia Quick Evaluation

## 2018-03-20 NOTE — Anesthesia Procedure Notes (Signed)
Procedure Name: Intubation Date/Time: 03/20/2018 1:09 PM Performed by: Cleda Daub, CRNA Pre-anesthesia Checklist: Patient identified, Emergency Drugs available, Suction available and Patient being monitored Patient Re-evaluated:Patient Re-evaluated prior to induction Oxygen Delivery Method: Circle system utilized Preoxygenation: Pre-oxygenation with 100% oxygen Induction Type: IV induction and Cricoid Pressure applied Ventilation: Mask ventilation without difficulty and Mask ventilation throughout procedure Laryngoscope Size: Mac and 3 Grade View: Grade I Tube type: Oral Tube size: 8.0 mm Number of attempts: 1 Airway Equipment and Method: Stylet Placement Confirmation: ETT inserted through vocal cords under direct vision,  positive ETCO2 and breath sounds checked- equal and bilateral Secured at: 23 cm Tube secured with: Tape Dental Injury: Teeth and Oropharynx as per pre-operative assessment

## 2018-03-20 NOTE — Brief Op Note (Signed)
03/20/2018  4:37 PM  PATIENT:  Bryan Mcguire  67 y.o. male  PRE-OPERATIVE DIAGNOSIS:  Spondylolisthesis  POST-OPERATIVE DIAGNOSIS:  Spondylolisthesis  PROCEDURE:  Procedure(s): ANTERIOR LATERAL LUMBAR FUSION WITH PERCUTANEOUS SCREW LUMBAR TWO-THREE, LUMBAR THREE-FOUR, LUMBAR FOUR-FIVE (Left)  SURGEON:  Surgeon(s) and Role:    * Earnie Larsson, MD - Primary    * Kary Kos, MD - Assisting  PHYSICIAN ASSISTANT:   ASSISTANTS:    ANESTHESIA:   general  EBL:  100 mL   BLOOD ADMINISTERED:none  DRAINS: none   LOCAL MEDICATIONS USED:  MARCAINE     SPECIMEN:  No Specimen  DISPOSITION OF SPECIMEN:  N/A  COUNTS:  YES  TOURNIQUET:  * No tourniquets in log *  DICTATION: .Dragon Dictation  PLAN OF CARE: Admit to inpatient   PATIENT DISPOSITION:  PACU - hemodynamically stable.   Delay start of Pharmacological VTE agent (>24hrs) due to surgical blood loss or risk of bleeding: yes
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
## 2018-03-20 NOTE — Transfer of Care (Signed)
Immediate Anesthesia Transfer of Care Note  Patient: Bryan Mcguire  Procedure(s) Performed: ANTERIOR LATERAL LUMBAR FUSION WITH PERCUTANEOUS SCREW LUMBAR TWO-THREE, LUMBAR THREE-FOUR, LUMBAR FOUR-FIVE (Left )  Patient Location: PACU  Anesthesia Type:General  Level of Consciousness: awake, alert , oriented and patient cooperative  Airway & Oxygen Therapy: Patient Spontanous Breathing and Patient connected to face mask oxygen  Post-op Assessment: Report given to RN and Post -op Vital signs reviewed and stable  Post vital signs: Reviewed and stable  Last Vitals:  Vitals Value Taken Time  BP 142/89 03/20/2018  4:53 PM  Temp    Pulse 80 03/20/2018  4:57 PM  Resp 18 03/20/2018  4:57 PM  SpO2 100 % 03/20/2018  4:57 PM  Vitals shown include unvalidated device data.  Last Pain:  Vitals:   03/20/18 0946  TempSrc: Oral         Complications: No apparent anesthesia complications

## 2018-03-20 NOTE — Op Note (Signed)
Date of procedure: 03/20/2018  Date of dictation: Same  Service: Neurosurgery  Preoperative diagnosis: L2-3, L3-4, L4-5 degenerative disc disease with stenosis and neurogenic claudication  Postoperative diagnosis: Same  Procedure Name: Left anterior lateral retroperitoneal L2-3, L3-4, L4-5 interbody decompression and fusion with interbody titanium cages and osteo cell plus  L2, L3, L4, L5 percutaneous pedicle screw fixation  Surgeon:Anayia Eugene A.Loriene Taunton, M.D.  Asst. Surgeon: Saintclair Halsted  Anesthesia: General  Indication: 67 year old male with progressive back and bilateral lower extremity symptoms failing conservative management.  Patient status post prior surgery at L3-4 and L4-5.  Patient with marked disc space collapse and significant foraminal and central stenosis at L2-3, L3-4 and L4-5.  Patient with retrolisthesis at L3-4 with some instability upon bending.  Patient has failed conservative management presents now for 3 level anterior lateral decompression and fusion.  Operative note: After induction of anesthesia, patient position in the right lateral decubitus position and appropriately padded.  Patient's left flank and lumbar region were prepped and draped sterilely.  Localizing images were obtained using the fluoroscopic unit.  A left lateral incision was made overlying the L3-4 disc space.  A second left flank incision was made and through the second flank incision blunt dissection was then made into the retroperitoneal space.  Peritoneal sac and contents were swept and mobilized anteriorly.  Returning to the original lateral incision a dilator was then passed through the abdominal wall and docked into the disc space at L4-5.  Fluoroscopy confirmed good positioning on the disc space.  Intraoperative neural monitoring was done and the lumbar plexus was found to be safely away from the dilator.  A K wire was then passed into the disc space at L4-5.  Once again the dilator was stimulated in all  directions and found to be free of any adjacent nerve tissue.  The dilator was then progressively enlarged and stimulated with each enlargement.  A self-retaining retractor was then placed over the dilators.  Retractor was then secured in place and anchored to the table.  The disc space was cleaned of soft tissue and direct stimulation was made overlying the disc space and surrounding area revealing no evidence of nearby neural structures.  Using fluoroscopy a shim was then introduced into the L4-5 disc space.  The space was then incised.  Discectomy was then performed using various instruments and curettes.  Contralateral release was then performed using Cobb elevators.  Disc space was then progressively dilated and a 10 mm lordotic implant was found to be most appropriate.  A 10 mm x 18 mm x 60 mm implant was then packed with ostia cell plus.  This was then impacted in place and confirmed to be in good position in both the AP and lateral planes of fluoroscopy.  Retractor was removed.  Procedure was then repeated in a similar fashion at both L3-4 and L2-3.  At L3-4 and L2 312 mm lordotic cages packed with ostia cell plus were placed.  The bed was then leveled.  Images confirmed good position of the cages with normal alignment of spine and improvement of the patient's retrolisthesis and deformity.  Given the good fit in appearance of the cages at all 3 levels I felt that unilateral pedicle screw fixation was adequate for stabilization.  Entry sites into the pedicles of L3-L4-L5 were identified using fluoroscopy.  Stab wounds were made using a 10 blade carried down through the lumbodorsal fascia.  Jamshidi needle was then passed into the pedicle at all 4 levels under fluoroscopic  guidance.  During passage of the Jamshidi introducer direct neural stimulation was performed and no evidence of pedicle breach was obtained.  Guidewires were passed into the vertebral bodies at all 4 levels.  Each pedicle was then tapped  with a screw tapped.  Each screw temple was found to be well-positioned both by fluoroscopy and by neural monitoring.  6.5 mm x 45 mm Nuvasive pedicle screws were placed at L2, L3, L4, L5.  Towers were lined up.  Rod was passed from cephalad screw to caudal screw with good visualization through the towers confirming good placement of the rod.  Locking caps were then placed into the towers and gradually tightened.  Locking caps drew down the rod and the rod was then given final tightening.  The right introducer was removed.  The screw towers were then removed.  Final images reveal good position of the cages and the hardware at the proper operative levels with normal alignment of the spine.  Wounds were then irrigated fanlike solution.  Marcaine was infiltrated for postoperative analgesia wounds were then closed in layers with Vicryl sutures.  Steri-Strips and sterile dressing were applied.  No apparent complications.  Patient tolerated the procedure well and he returns to the recovery room postop.

## 2018-03-21 LAB — GLUCOSE, CAPILLARY
GLUCOSE-CAPILLARY: 231 mg/dL — AB (ref 70–99)
GLUCOSE-CAPILLARY: 193 mg/dL — AB (ref 70–99)
Glucose-Capillary: 174 mg/dL — ABNORMAL HIGH (ref 70–99)
Glucose-Capillary: 212 mg/dL — ABNORMAL HIGH (ref 70–99)
Glucose-Capillary: 233 mg/dL — ABNORMAL HIGH (ref 70–99)

## 2018-03-21 MED ORDER — MANAGING BACK PAIN BOOK
Freq: Once | Status: AC
  Administered 2018-03-21: 10:00:00
  Filled 2018-03-21: qty 1

## 2018-03-21 NOTE — Anesthesia Postprocedure Evaluation (Signed)
Anesthesia Post Note  Patient: Bryan Mcguire  Procedure(s) Performed: ANTERIOR LATERAL LUMBAR FUSION WITH PERCUTANEOUS SCREW LUMBAR TWO-THREE, LUMBAR THREE-FOUR, LUMBAR FOUR-FIVE (Left )     Patient location during evaluation: PACU Anesthesia Type: General Level of consciousness: awake and alert Pain management: pain level controlled Vital Signs Assessment: post-procedure vital signs reviewed and stable Respiratory status: spontaneous breathing, nonlabored ventilation, respiratory function stable and patient connected to nasal cannula oxygen Cardiovascular status: blood pressure returned to baseline and stable Postop Assessment: no apparent nausea or vomiting Anesthetic complications: no    Last Vitals:  Vitals:   03/21/18 0025 03/21/18 0304  BP:  129/80  Pulse: 97 92  Resp: 18 19  Temp:  36.8 C  SpO2: 92% 97%    Last Pain:  Vitals:   03/21/18 0545  TempSrc:   PainSc: Asleep                 [name]

## 2018-03-21 NOTE — Progress Notes (Signed)
Orthopedic Tech Progress Note Patient Details:  Bryan Mcguire [birth year] [id number]  Patient ID: [name], male   DOB: 09-Feb-1951, 67 y.o.   MRN: [id number]   [place] Pink 03/21/2018, 9:40 AMCalled [employer] for brace.

## 2018-03-21 NOTE — Evaluation (Signed)
Occupational Therapy Evaluation Patient Details Name: Bryan Mcguire MRN: [id number] DOB: 1950/11/22 Today's Date: 03/21/2018    History of Present Illness Pt is a 67 y.o. male s/p L2-5 ALIF. PMHx: Anxiety, Arthritis, CAD, DDD, DM 2, GERD, HTN, MI, Neuropathy, Cardiac cath, Cervical fusion, Lumbar laminectomy.   Clinical Impression   Pt reports he was independent with ADL PTA. Currently pt min assist with LB ADL and min guard for functional mobility. Began back, safety, and ADL education completed with pt; he verbalized understanding but would benefit from further practice. Session limited today as pt with nausea/emesis; RN notified and provided medication during session. Pt planning to d/c home with intermittent supervision from family/friends. Pt would benefit from continued skilled OT to address established goals.    Follow Up Recommendations  No OT follow up;Supervision - Intermittent    Equipment Recommendations  None recommended by OT    Recommendations for Other Services PT consult     Precautions / Restrictions Precautions Precautions: Back;Fall Precaution Booklet Issued: No Precaution Comments: Educated pt on back precautions Required Braces or Orthoses: Spinal Brace Spinal Brace: Lumbar corset;Applied in sitting position Restrictions Weight Bearing Restrictions: No      Mobility Bed Mobility Overal bed mobility: Needs Assistance Bed Mobility: Rolling;Sidelying to Sit;Sit to Sidelying Rolling: Supervision Sidelying to sit: Supervision     Sit to sidelying: Min assist General bed mobility comments: Min assist for LEs into bed. HOB flat with use of bed rails. Good log roll technique  Transfers Overall transfer level: Needs assistance Equipment used: Rolling walker (2 wheeled) Transfers: Sit to/from Stand Sit to Stand: Min guard         General transfer comment: Cues for hand placement. Min guard for safety    Balance Overall balance assessment: Needs  assistance Sitting-balance support: Feet supported;No upper extremity supported Sitting balance-Leahy Scale: Good     Standing balance support: Bilateral upper extremity supported Standing balance-Leahy Scale: Poor Standing balance comment: RW for support                           ADL either performed or assessed with clinical judgement   ADL Overall ADL's : Needs assistance/impaired Eating/Feeding: Set up;Sitting   Grooming: Min guard;Standing Grooming Details (indicate cue type and reason): Educated on use of 2 cups for oral care Upper Body Bathing: Set up;Sitting;Supervision/ safety   Lower Body Bathing: Minimal assistance;Sit to/from stand   Upper Body Dressing : Set up;Supervision/safety;Sitting Upper Body Dressing Details (indicate cue type and reason): no brace present; needs further education Lower Body Dressing: Minimal assistance;Sit to/from stand Lower Body Dressing Details (indicate cue type and reason): Pt unable to cross foot over opposite knee. Has reacher at home; would benefit from practice Toilet Transfer: Min guard;Ambulation;RW Toilet Transfer Details (indicate cue type and reason): Simulated by sit to stand from EOB with functional mobility in room   Toileting - Clothing Manipulation Details (indicate cue type and reason): Educated on use of wet wipes and proper technqiue for peri care without twisting     Functional mobility during ADLs: Min guard;Rolling walker General ADL Comments: Educated pt on maintaining back precautions during functional activities, log roll for bed mobility, and keeping frequently used items at counter top height. Pt with nausea and emesis during session; RN notified and provided medication during session     Vision         Perception     Praxis  Pertinent Vitals/Pain Pain Assessment: Faces Faces Pain Scale: Hurts little more Pain Location: back Pain Descriptors / Indicators: Discomfort;Sore Pain  Intervention(s): Monitored during session     Hand Dominance     Extremity/Trunk Assessment Upper Extremity Assessment Upper Extremity Assessment: Overall WFL for tasks assessed   Lower Extremity Assessment Lower Extremity Assessment: Defer to PT evaluation   Cervical / Trunk Assessment Cervical / Trunk Assessment: Other exceptions Cervical / Trunk Exceptions: s/p lumbar sx   Communication Communication Communication: No difficulties   Cognition Arousal/Alertness: Awake/alert Behavior During Therapy: WFL for tasks assessed/performed Overall Cognitive Status: Within Functional Limits for tasks assessed                                     General Comments       Exercises     Shoulder Instructions      Home Living Family/patient expects to be discharged to:: Private residence Living Arrangements: Alone Available Help at Discharge: Family;Friend(s);Available PRN/intermittently Type of Home: House Home Access: Level entry     Home Layout: One level     Bathroom Shower/Tub: Occupational psychologist: Standard     Home Equipment: Shower seat - built in;Cane - single point;Grab bars - tub/shower;Toilet riser;Adaptive equipment Adaptive Equipment: Reacher Additional Comments: Friends/family planning to bring meals and check on him      Prior Functioning/Environment Level of Independence: Independent                 OT Problem List: Decreased strength;Impaired balance (sitting and/or standing);Decreased knowledge of use of DME or AE;Decreased knowledge of precautions;Pain      OT Treatment/Interventions: Self-care/ADL training;DME and/or AE instruction;Therapeutic activities;Patient/family education;Balance training    OT Goals(Current goals can be found in the care plan section) Acute Rehab OT Goals Patient Stated Goal: return home OT Goal Formulation: With patient Time For Goal Achievement: 04/04/18 Potential to Achieve Goals:  Good ADL Goals Pt Will Perform Lower Body Bathing: with modified independence;sit to/from stand;with adaptive equipment Pt Will Perform Lower Body Dressing: with modified independence;sit to/from stand;with adaptive equipment Pt Will Perform Tub/Shower Transfer: Shower transfer;with modified independence;ambulating;shower seat;rolling walker;grab bars Additional ADL Goal #1: Pt will independently verbally recall 3/3 back precautions and maintain throughout ADL. Additional ADL Goal #2: Pt will don/doff back brace with set up as precursor to ADL.  OT Frequency: Min 2X/week   Barriers to D/C: Decreased caregiver support  pt lives alone       Co-evaluation              AM-PAC PT "6 Clicks" Daily Activity     Outcome Measure Help from another person eating meals?: None Help from another person taking care of personal grooming?: A Little Help from another person toileting, which includes using toliet, bedpan, or urinal?: A Little Help from another person bathing (including washing, rinsing, drying)?: A Little Help from another person to put on and taking off regular upper body clothing?: A Little Help from another person to put on and taking off regular lower body clothing?: A Little 6 Click Score: 19   End of Session Equipment Utilized During Treatment: Rolling walker Nurse Communication: Mobility status;Other (comment)(pt with nausea/emesis)  Activity Tolerance: Patient tolerated treatment well Patient left: in bed;with call bell/phone within reach  OT Visit Diagnosis: Unsteadiness on feet (R26.81);Pain Pain - part of body: (back)  Time: 1007-1030 OT Time Calculation (min): 23 min Charges:  OT General Charges $OT Visit: 1 Visit OT Evaluation $OT Eval Moderate Complexity: 1 Mod OT Treatments $Self Care/Home Management : 8-22 mins  Arisa Congleton A. Ulice Brilliant, M.S., OTR/L Acute Rehab Department: [phone]  Binnie Kand 03/21/2018, 10:42 AM

## 2018-03-21 NOTE — Progress Notes (Signed)
Subjective: Patient reports Doing well no leg pain condition of back pain  Objective: Vital signs in last 24 hours: Temp:  [96.6 F (35.9 C)-98.7 F (37.1 C)] 98.2 F (36.8 C) (07/27 0304) Pulse Rate:  [63-97] 92 (07/27 0304) Resp:  [2-23] 19 (07/27 0304) BP: (129-177)/(79-105) 129/80 (07/27 0304) SpO2:  [92 %-100 %] 97 % (07/27 0304) Weight:  [83.5 kg (184 lb)] 83.5 kg (184 lb) (07/26 0946)  Intake/Output from previous day: 07/26 0701 - 07/27 0700 In: 3230.3 [P.O.:822; I.V.:2308.3; IV Piggyback:100] Out: 3400 [Urine:3300; Blood:100] Intake/Output this shift: No intake/output data recorded.  Strength out of 5 wound clean dry and intact  Lab Results: Recent Labs    03/20/18 0929  WBC 6.8  HGB 13.9  HCT 41.6  PLT 243   BMET Recent Labs    03/20/18 0929  NA 138  K 5.2*  CL 102  CO2 27  GLUCOSE 186*  BUN 16  CREATININE 1.21  CALCIUM 9.8    Studies/Results: Dg Lumbar Spine 2-3 Views  Result Date: 03/20/2018 CLINICAL DATA:  67 year old male undergoing lumbar spine fusion. EXAM: DG C-ARM 61-120 MIN; LUMBAR SPINE - 2-3 VIEW COMPARISON:  [facility] lumbar MRI 06/17/2017. FINDINGS: Three intraoperative fluoroscopic spot views of the lumbar spine. On the single lateral view transpedicular hardware and interbody implants are demonstrated L2-L3 through L4-L5. The AP views demonstrate the transpedicular hardware and posterior connecting rod is unilateral on the left. Interbody implant placement appears satisfactory. Decreased retrolisthesis is noted at L3-L4. IMPRESSION: L2-L3 through L4-L5 interbody and left unilateral posterior fusion depicted. Electronically Signed   By: Genevie Ann M.D.   On: 03/20/2018 16:31   Dg C-arm 1-60 Min  Result Date: 03/20/2018 CLINICAL DATA:  67 year old male undergoing lumbar spine fusion. EXAM: DG C-ARM 61-120 MIN; LUMBAR SPINE - 2-3 VIEW COMPARISON:  [facility] lumbar MRI 06/17/2017. FINDINGS: Three intraoperative fluoroscopic spot  views of the lumbar spine. On the single lateral view transpedicular hardware and interbody implants are demonstrated L2-L3 through L4-L5. The AP views demonstrate the transpedicular hardware and posterior connecting rod is unilateral on the left. Interbody implant placement appears satisfactory. Decreased retrolisthesis is noted at L3-L4. IMPRESSION: L2-L3 through L4-L5 interbody and left unilateral posterior fusion depicted. Electronically Signed   By: Genevie Ann M.D.   On: 03/20/2018 16:31   Dg C-arm 1-60 Min  Result Date: 03/20/2018 CLINICAL DATA:  67 year old male undergoing lumbar spine fusion. EXAM: DG C-ARM 61-120 MIN; LUMBAR SPINE - 2-3 VIEW COMPARISON:  [facility] lumbar MRI 06/17/2017. FINDINGS: Three intraoperative fluoroscopic spot views of the lumbar spine. On the single lateral view transpedicular hardware and interbody implants are demonstrated L2-L3 through L4-L5. The AP views demonstrate the transpedicular hardware and posterior connecting rod is unilateral on the left. Interbody implant placement appears satisfactory. Decreased retrolisthesis is noted at L3-L4. IMPRESSION: L2-L3 through L4-L5 interbody and left unilateral posterior fusion depicted. Electronically Signed   By: Genevie Ann M.D.   On: 03/20/2018 16:31   Dg C-arm 1-60 Min  Result Date: 03/20/2018 CLINICAL DATA:  67 year old male undergoing lumbar spine fusion. EXAM: DG C-ARM 61-120 MIN; LUMBAR SPINE - 2-3 VIEW COMPARISON:  [facility] lumbar MRI 06/17/2017. FINDINGS: Three intraoperative fluoroscopic spot views of the lumbar spine. On the single lateral view transpedicular hardware and interbody implants are demonstrated L2-L3 through L4-L5. The AP views demonstrate the transpedicular hardware and posterior connecting rod is unilateral on the left. Interbody implant placement appears satisfactory. Decreased retrolisthesis is noted at L3-L4. IMPRESSION: L2-L3  through L4-L5 interbody and left unilateral posterior fusion  depicted. Electronically Signed   By: Genevie Ann M.D.   On: 03/20/2018 16:31    Assessment/Plan: Post-op day 1 3 level anterior lateral interbody fusion doing fairly well mobilized in his brace later today.  LOS: 1 day     Randee Huston P 03/21/2018, 8:04 AM

## 2018-03-21 NOTE — Evaluation (Signed)
Physical Therapy Evaluation Patient Details Name: Bryan Mcguire MRN: [id number] DOB: 27-Apr-1951 Today's Date: 03/21/2018   History of Present Illness  Pt is a 67 y.o. male s/p L2-5 ALIF. PMHx: Anxiety, Arthritis, CAD, DDD, DM 2, GERD, HTN, MI, Neuropathy, Cardiac cath, Cervical fusion, Lumbar laminectomy.  Clinical Impression  Patient seen for mobility assessment s/p spinal surgery. Mobilizing well with modest deficits in functional mobility. Educated patient on precautions, mobility expectations, safety and car transfers. Will continue to benefit from acute PT services to reinforce mobility and safety. Will see as indicated and progress as tolerated.    Follow Up Recommendations No PT follow up;Supervision - Intermittent    Equipment Recommendations  Rolling walker with 5" wheels    Recommendations for Other Services       Precautions / Restrictions Precautions Precautions: Back;Fall Precaution Booklet Issued: No Precaution Comments: Educated pt on back precautions Required Braces or Orthoses: Spinal Brace Spinal Brace: Lumbar corset;Applied in sitting position Restrictions Weight Bearing Restrictions: No      Mobility  Bed Mobility Overal bed mobility: Needs Assistance Bed Mobility: Rolling;Sidelying to Sit;Sit to Sidelying Rolling: Supervision Sidelying to sit: Supervision       General bed mobility comments: Supervision to come to EOB  Transfers Overall transfer level: Needs assistance Equipment used: Rolling walker (2 wheeled) Transfers: Sit to/from Stand Sit to Stand: Min guard         General transfer comment: Cues for hand placement. Min guard for safety  Ambulation/Gait Ambulation/Gait assistance: Min guard Gait Distance (Feet): 240 Feet Assistive device: Rolling walker (2 wheeled) Gait Pattern/deviations: Step-through pattern;Decreased stride length;Trunk flexed Gait velocity: decreased Gait velocity interpretation: <1.8 ft/sec, indicate of risk for  recurrent falls General Gait Details: Modest instability noted, VCs for increased cadence and relaxation of UEs during mobility  Stairs            Wheelchair Mobility    Modified Rankin (Stroke Patients Only)       Balance Overall balance assessment: Needs assistance Sitting-balance support: Feet supported;No upper extremity supported Sitting balance-Leahy Scale: Good     Standing balance support: Bilateral upper extremity supported Standing balance-Leahy Scale: Poor Standing balance comment: RW for support                             Pertinent Vitals/Pain Pain Assessment: 0-10 Pain Score: 3  Pain Location: back Pain Descriptors / Indicators: Discomfort;Sore Pain Intervention(s): Monitored during session    Home Living Family/patient expects to be discharged to:: Private residence Living Arrangements: Alone Available Help at Discharge: Family;Friend(s);Available PRN/intermittently Type of Home: House Home Access: Level entry     Home Layout: One level Home Equipment: Shower seat - built in;Cane - single point;Grab bars - tub/shower;Toilet riser;Adaptive equipment Additional Comments: Friends/family planning to bring meals and check on him    Prior Function Level of Independence: Independent               Hand Dominance        Extremity/Trunk Assessment   Upper Extremity Assessment Upper Extremity Assessment: Overall WFL for tasks assessed    Lower Extremity Assessment Lower Extremity Assessment: LLE deficits/detail LLE Sensation: decreased light touch    Cervical / Trunk Assessment Cervical / Trunk Assessment: Other exceptions Cervical / Trunk Exceptions: s/p lumbar sx  Communication   Communication: No difficulties  Cognition Arousal/Alertness: Awake/alert Behavior During Therapy: WFL for tasks assessed/performed Overall Cognitive Status: Within Functional Limits for tasks assessed  General Comments      Exercises     Assessment/Plan    PT Assessment Patient needs continued PT services  PT Problem List Decreased strength;Decreased activity tolerance;Decreased mobility;Decreased balance;Decreased knowledge of precautions       PT Treatment Interventions DME instruction;Gait training;Functional mobility training;Therapeutic activities;Therapeutic exercise;Balance training;Patient/family education    PT Goals (Current goals can be found in the Care Plan section)  Acute Rehab PT Goals Patient Stated Goal: return home PT Goal Formulation: With patient Time For Goal Achievement: 04/04/18 Potential to Achieve Goals: Good    Frequency Min 5X/week   Barriers to discharge        Co-evaluation               AM-PAC PT "6 Clicks" Daily Activity  Outcome Measure Difficulty turning over in bed (including adjusting bedclothes, sheets and blankets)?: A Little Difficulty moving from lying on back to sitting on the side of the bed? : A Little Difficulty sitting down on and standing up from a chair with arms (e.g., wheelchair, bedside commode, etc,.)?: A Little Help needed moving to and from a bed to chair (including a wheelchair)?: A Little Help needed walking in hospital room?: A Little Help needed climbing 3-5 steps with a railing? : A Little 6 Click Score: 18    End of Session Equipment Utilized During Treatment: Back brace Activity Tolerance: Patient tolerated treatment well Patient left: in bed;with call bell/phone within reach Nurse Communication: Mobility status PT Visit Diagnosis: Unsteadiness on feet (R26.81);Difficulty in walking, not elsewhere classified (R26.2)    Time: 0174-9449 PT Time Calculation (min) (ACUTE ONLY): 20 min   Charges:   PT Evaluation $PT Eval Moderate Complexity: 1 Mod          Bryan Mcguire, PT DPT  Board Certified Neurologic Specialist [phone]   Bryan Mcguire 03/21/2018, 3:33 PM

## 2018-03-22 LAB — GLUCOSE, CAPILLARY
Glucose-Capillary: 223 mg/dL — ABNORMAL HIGH (ref 70–99)
Glucose-Capillary: 192 mg/dL — ABNORMAL HIGH (ref 70–99)

## 2018-03-22 MED ORDER — OXYCODONE-ACETAMINOPHEN 5-325 MG PO TABS: 1.0000 | ORAL_TABLET | ORAL | 0 refills | Status: DC | PRN

## 2018-03-22 MED ORDER — METHOCARBAMOL 500 MG PO TABS: 500.0000 mg | ORAL_TABLET | Freq: Four times a day (QID) | ORAL | 0 refills | Status: DC

## 2018-03-22 NOTE — Plan of Care (Signed)
Patient stable, discussed POC with patient, agreeable with plan, denies question/concerns at this time.  

## 2018-03-22 NOTE — Discharge Summary (Signed)
Physician Discharge Summary  Patient ID: Bryan Mcguire MRN: [id number] DOB/AGE: 1951/07/26 67 y.o.  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 The patient tolerated the procedure well and was taken to the recovery room and then to the floor in stable condition. The hospital course was routine. There were no complications. The wound remained clean dry and intact. Pt had appropriate back soreness. No complaints of leg pain or new N/T/W. The patient remained afebrile with stable vital signs, and tolerated a regular diet. The patient continued to increase activities, and pain was well controlled with oral pain medications.   Consults: None  Significant Diagnostic Studies:  Results for orders placed or performed during the hospital encounter of 02/77/41  Basic metabolic panel  Result Value Ref Range   Sodium 138 135 - 145 mmol/L   Potassium 5.2 (H) 3.5 - 5.1 mmol/L   Chloride 102 98 - 111 mmol/L   CO2 27 22 - 32 mmol/L   Glucose, Bld 186 (H) 70 - 99 mg/dL   BUN 16 8 - 23 mg/dL   Creatinine, Ser 1.21 0.61 - 1.24 mg/dL   Calcium 9.8 8.9 - 10.3 mg/dL   GFR calc non Af Amer 60 (L) >60 mL/min   GFR calc Af Amer >60 >60 mL/min   Anion gap 9 5 - 15  CBC WITH DIFFERENTIAL  Result Value Ref Range   WBC 6.8 4.0 - 10.5 K/uL   RBC 4.30 4.22 - 5.81 MIL/uL   Hemoglobin 13.9 13.0 - 17.0 g/dL   HCT 41.6 39.0 - 52.0 %   MCV 96.7 78.0 - 100.0 fL   MCH 32.3 26.0 - 34.0 pg   MCHC 33.4 30.0 - 36.0 g/dL   RDW 12.6 11.5 - 15.5 %   Platelets 243 150 - 400 K/uL   Neutrophils Relative % 64 %   Neutro Abs 4.4 1.7 - 7.7 K/uL   Lymphocytes Relative 22 %   Lymphs Abs 1.5 0.7 - 4.0 K/uL   Monocytes Relative 10 %   Monocytes Absolute 0.7 0.1 - 1.0 K/uL   Eosinophils Relative 2 %   Eosinophils Absolute 0.1 0.0 - 0.7 K/uL   Basophils Relative 1 %   Basophils Absolute 0.0 0.0 - 0.1 K/uL   Immature Granulocytes 1 %   Abs Immature Granulocytes 0.1 0.0 - 0.1 K/uL  Glucose, capillary  Result Value Ref Range   Glucose-Capillary 174 (H) 70 - 99 mg/dL   Comment 1 Notify RN   Glucose, capillary  Result Value Ref Range   Glucose-Capillary 184 (H) 70 - 99 mg/dL  Glucose, capillary  Result Value Ref Range   Glucose-Capillary 238 (H) 70 - 99 mg/dL   Comment 1 Notify RN   Glucose, capillary  Result Value Ref Range   Glucose-Capillary 212 (H) 70 - 99 mg/dL  Glucose, capillary  Result Value Ref Range   Glucose-Capillary 233 (H) 70 - 99 mg/dL   Comment 1 Notify RN   Glucose, capillary  Result Value Ref Range   Glucose-Capillary 231 (H) 70 - 99 mg/dL   Comment 1 Notify RN    Comment 2 Document in Chart   Glucose, capillary  Result Value Ref Range   Glucose-Capillary 193 (H) 70 - 99 mg/dL   Comment 1 Notify RN    Comment 2 Document in Chart   Glucose, capillary  Result Value Ref Range   Glucose-Capillary 174 (H) 70 - 99 mg/dL   Comment 1 Notify RN   Glucose, capillary  Result Value Ref Range   Glucose-Capillary 223 (H) 70 - 99 mg/dL   Comment 1 Notify RN    Comment 2 Document in Chart   Type and screen  Result Value Ref Range   ABO/RH(D) A NEG    Antibody Screen NEG    Sample Expiration      03/23/2018 Performed at [facility] [address] [place], [place] [address]     Dg Lumbar Spine 2-3 Views  Result Date: 03/20/2018 CLINICAL DATA:  67 year old male undergoing lumbar spine fusion. EXAM: DG C-ARM 61-120 MIN; LUMBAR SPINE - 2-3 VIEW COMPARISON:  [facility] lumbar MRI 06/17/2017. FINDINGS: Three intraoperative fluoroscopic spot views of the lumbar spine. On the single lateral view  transpedicular hardware and interbody implants are demonstrated L2-L3 through L4-L5. The AP views demonstrate the transpedicular hardware and posterior connecting rod is unilateral on the left. Interbody implant placement appears satisfactory. Decreased retrolisthesis is noted at L3-L4. IMPRESSION: L2-L3 through L4-L5 interbody and left unilateral posterior fusion depicted. Electronically Signed   By: Genevie Ann M.D.   On: 03/20/2018 16:31   Dg C-arm 1-60 Min  Result Date: 03/20/2018 CLINICAL DATA:  67 year old male undergoing lumbar spine fusion. EXAM: DG C-ARM 61-120 MIN; LUMBAR SPINE - 2-3 VIEW COMPARISON:  [facility] lumbar MRI 06/17/2017. FINDINGS: Three intraoperative fluoroscopic spot views of the lumbar spine. On the single lateral view transpedicular hardware and interbody implants are demonstrated L2-L3 through L4-L5. The AP views demonstrate the transpedicular hardware and posterior connecting rod is unilateral on the left. Interbody implant placement appears satisfactory. Decreased retrolisthesis is noted at L3-L4. IMPRESSION: L2-L3 through L4-L5 interbody and left unilateral posterior fusion depicted. Electronically Signed   By: Genevie Ann M.D.   On: 03/20/2018 16:31   Dg C-arm 1-60 Min  Result Date: 03/20/2018 CLINICAL DATA:  67 year old male undergoing lumbar spine fusion. EXAM: DG C-ARM 61-120 MIN; LUMBAR SPINE - 2-3 VIEW COMPARISON:  [facility] lumbar MRI 06/17/2017. FINDINGS: Three intraoperative fluoroscopic spot views of the lumbar spine. On the single lateral view transpedicular hardware and interbody implants are demonstrated L2-L3 through L4-L5. The AP views demonstrate the transpedicular hardware and posterior connecting rod is unilateral on the left. Interbody implant placement appears satisfactory. Decreased retrolisthesis is noted at L3-L4. IMPRESSION: L2-L3 through L4-L5 interbody and left unilateral posterior fusion depicted. Electronically Signed   By: Genevie Ann M.D.   On:  03/20/2018 16:31   Dg C-arm 1-60 Min  Result Date: 03/20/2018 CLINICAL DATA:  67 year old male undergoing lumbar spine fusion. EXAM: DG C-ARM 61-120 MIN; LUMBAR SPINE - 2-3 VIEW COMPARISON:  [facility] lumbar MRI 06/17/2017. FINDINGS: Three intraoperative fluoroscopic spot views of the lumbar spine. On the single lateral view transpedicular hardware and interbody implants are demonstrated L2-L3 through L4-L5. The AP views demonstrate the transpedicular hardware and posterior connecting rod is unilateral on the left. Interbody implant placement appears satisfactory. Decreased retrolisthesis is noted at L3-L4. IMPRESSION: L2-L3 through L4-L5 interbody and left unilateral posterior fusion depicted. Electronically Signed   By: Genevie Ann M.D.   On: 03/20/2018 16:31    Antibiotics:  Anti-infectives (From admission, onward)   Start  Dose/Rate Route Frequency Ordered Stop   03/20/18 2100  ceFAZolin (ANCEF) IVPB 1 g/50 mL premix     1 g 100 mL/hr over 30 Minutes Intravenous Every 8 hours 03/20/18 2029 03/21/18 0545   03/20/18 1239  bacitracin 50,000 Units in sodium chloride 0.9 % 500 mL irrigation  Status:  Discontinued       As needed 03/20/18 1410 03/20/18 1649   03/20/18 0930  ceFAZolin (ANCEF) IVPB 2g/100 mL premix     2 g 200 mL/hr over 30 Minutes Intravenous On call to O.R. 03/20/18 0925 03/20/18 1323   03/20/18 0930  ceFAZolin (ANCEF) 2-4 GM/100ML-% IVPB    Note to Pharmacy:  Maryjean Ka   : cabinet override      03/20/18 0930 03/20/18 1313      Discharge Exam: Blood pressure 114/68, pulse 76, temperature 98.3 F (36.8 C), temperature source Oral, resp. rate 15, height 5' 7.5" (1.715 m), weight 83.5 kg (184 lb), SpO2 98 %. Neurologic: Grossly normal Ambulating and voiding well  Discharge Medications:   Allergies as of 03/22/2018   No Known Allergies     Medication List    TAKE these medications   ALPRAZolam 0.5 MG tablet Commonly known as:  XANAX Take 0.5 mg by mouth at  bedtime as needed for anxiety.   aspirin 81 MG tablet Take 81 mg by mouth daily.   [employer] ANTI-ITCH lotion Generic drug:  camphor-menthol Apply 1 application topically as needed for itching.   HYDROcodone-acetaminophen 5-325 MG tablet Commonly known as:  NORCO Take 1 tablet by mouth every 6 (six) hours as needed for pain.   metFORMIN 750 MG 24 hr tablet Commonly known as:  GLUCOPHAGE-XR Take 500 mg by mouth 2 (two) times daily.   methocarbamol 500 MG tablet Commonly known as:  ROBAXIN Take 1 tablet (500 mg total) by mouth 4 (four) times daily.   metoprolol tartrate 50 MG tablet Commonly known as:  LOPRESSOR TAKE 1 TABLET BY MOUTH TWICE DAILY.   oxyCODONE-acetaminophen 5-325 MG tablet Commonly known as:  PERCOCET Take 1 tablet by mouth every 4 (four) hours as needed for severe pain.   pantoprazole 40 MG tablet Commonly known as:  PROTONIX Take 40 mg by mouth daily.   pravastatin 10 MG tablet Commonly known as:  PRAVACHOL Take 10 mg by mouth daily.   pregabalin 75 MG capsule Commonly known as:  LYRICA Take 75 mg by mouth 2 (two) times daily.   tamsulosin 0.4 MG Caps capsule Commonly known as:  FLOMAX Take 0.4 mg by mouth daily.   Vitamin D (Ergocalciferol) 50000 units Caps capsule Commonly known as:  DRISDOL Take 50,000 Units by mouth every 7 (seven) days. Mondays   zolpidem 10 MG tablet Commonly known as:  AMBIEN Take 10 mg by mouth at bedtime.            Durable Medical Equipment  (From admission, onward)        Start     Ordered   03/20/18 2030  DME Walker rolling  Once    Question:  Patient needs a walker to treat with the following condition  Answer:  Degenerative spondylolisthesis   03/20/18 2029   03/20/18 2030  DME 3 n 1  Once     03/20/18 2029      Disposition: home   Final Dx: decompression and fusion L2-3, 3-4,4-5  Discharge Instructions     Remove dressing in 72 hours   Complete by:  As directed  Call MD for:  difficulty  breathing, headache or visual disturbances   Complete by:  As directed    Call MD for:  hives   Complete by:  As directed    Call MD for:  persistant nausea and vomiting   Complete by:  As directed    Call MD for:  redness, tenderness, or signs of infection (pain, swelling, redness, odor or green/yellow discharge around incision site)   Complete by:  As directed    Call MD for:  severe uncontrolled pain   Complete by:  As directed    Call MD for:  temperature >100.4   Complete by:  As directed    Diet - low sodium heart healthy   Complete by:  As directed    Driving Restrictions   Complete by:  As directed    No driving 2 weeks   Increase activity slowly   Complete by:  As directed       Follow-up Information    Earnie Larsson, MD. Schedule an appointment as soon as possible for a visit in 2 week(s).   Specialty:  Neurosurgery Contact information: [address] [place] [address] [phone]            Signed: Ocie Cornfield [facility] 03/22/2018, 8:44 AM

## 2018-03-22 NOTE — Progress Notes (Signed)
Physical Therapy Treatment Patient Details Name: Bryan Mcguire MRN: [id number] DOB: 1951-06-15 Today's Date: 03/22/2018    History of Present Illness Pt is a 67 y.o. male s/p L2-5 ALIF. PMHx: Anxiety, Arthritis, CAD, DDD, DM 2, GERD, HTN, MI, Neuropathy, Cardiac cath, Cervical fusion, Lumbar laminectomy.    PT Comments    Patient progressing with mobility, ambulated in halls increased distance with good recall of precautions today. OF NOTE: patient did experience 2 episodes of LLE buckling when ambulating causing near fall requiring increased assist to maintain upright. Heavy reliance on RW. Patient educated on quad setting to improve stability during weight shift to LLE. Current POC remains appropriate. Anticipate patient will need PT follow up once cleared for activity from MD.   Follow Up Recommendations  No PT follow up;Supervision - Intermittent     Equipment Recommendations  Rolling walker with 5" wheels    Recommendations for Other Services       Precautions / Restrictions Precautions Precautions: Back;Fall Precaution Booklet Issued: No Precaution Comments: Educated pt on back precautions Required Braces or Orthoses: Spinal Brace Spinal Brace: Lumbar corset;Applied in sitting position Restrictions Weight Bearing Restrictions: No    Mobility  Bed Mobility Overal bed mobility: Needs Assistance Bed Mobility: Rolling;Sidelying to Sit;Sit to Sidelying Rolling: Supervision Sidelying to sit: Supervision       General bed mobility comments: Supervision to come to EOB  Transfers Overall transfer level: Needs assistance Equipment used: Rolling walker (2 wheeled) Transfers: Sit to/from Stand Sit to Stand: Min guard         General transfer comment: Cues for hand placement. Min guard for safety  Ambulation/Gait Ambulation/Gait assistance: Min guard Gait Distance (Feet): 260 Feet Assistive device: Rolling walker (2 wheeled) Gait Pattern/deviations: Step-through  pattern;Decreased stride length;Trunk flexed Gait velocity: decreased Gait velocity interpretation: <1.8 ft/sec, indicate of risk for recurrent falls General Gait Details: 2 noted LOB with LLE buckling during ambulation. Moderate assist to prevent fall x2. Cues for quad setting during weight shift   Stairs             Wheelchair Mobility    Modified Rankin (Stroke Patients Only)       Balance Overall balance assessment: Needs assistance Sitting-balance support: Feet supported;No upper extremity supported Sitting balance-Leahy Scale: Good     Standing balance support: Bilateral upper extremity supported Standing balance-Leahy Scale: Poor Standing balance comment: RW for support                            Cognition Arousal/Alertness: Awake/alert Behavior During Therapy: WFL for tasks assessed/performed Overall Cognitive Status: Within Functional Limits for tasks assessed                                        Exercises      General Comments        Pertinent Vitals/Pain Pain Assessment: 0-10 Faces Pain Scale: Hurts little more Pain Location: back Pain Descriptors / Indicators: Discomfort;Sore Pain Intervention(s): Monitored during session    Home Living                      Prior Function            PT Goals (current goals can now be found in the care plan section) Acute Rehab PT Goals Patient Stated Goal: return home PT Goal Formulation:  With patient Time For Goal Achievement: 04/04/18 Potential to Achieve Goals: Good Progress towards PT goals: Progressing toward goals    Frequency    Min 5X/week      PT Plan Current plan remains appropriate    Co-evaluation              AM-PAC PT "6 Clicks" Daily Activity  Outcome Measure  Difficulty turning over in bed (including adjusting bedclothes, sheets and blankets)?: A Little Difficulty moving from lying on back to sitting on the side of the bed? : A  Little Difficulty sitting down on and standing up from a chair with arms (e.g., wheelchair, bedside commode, etc,.)?: A Little Help needed moving to and from a bed to chair (including a wheelchair)?: A Little Help needed walking in hospital room?: A Little Help needed climbing 3-5 steps with a railing? : A Little 6 Click Score: 18    End of Session Equipment Utilized During Treatment: Back brace Activity Tolerance: Patient tolerated treatment well Patient left: in bed;with call bell/phone within reach(sitting EOB) Nurse Communication: Mobility status PT Visit Diagnosis: Unsteadiness on feet (R26.81);Difficulty in walking, not elsewhere classified (R26.2)     Time: 3832-9191 PT Time Calculation (min) (ACUTE ONLY): 18 min  Charges:  $Gait Training: 8-22 mins                     Alben Deeds, PT DPT  Board Certified Neurologic Specialist [place] 03/22/2018, 9:16 AM

## 2018-03-22 NOTE — Care Management (Signed)
RW ordered from [facility] for pt per recommendations and pt request.  Pt states he does not need a 3n1 as he purchased an "extender" for toilet.  No other home needs at this time.

## 2018-03-22 NOTE — Progress Notes (Signed)
Occupational Therapy Treatment Patient Details Name: Bryan Mcguire MRN: [id number] DOB: 1950-11-24 Today's Date: 03/22/2018    History of present illness Pt is a 67 y.o. male s/p L2-5 ALIF. PMHx: Anxiety, Arthritis, CAD, DDD, DM 2, GERD, HTN, MI, Neuropathy, Cardiac cath, Cervical fusion, Lumbar laminectomy.   OT comments  Pt sitting EOB preparing for discharge, and son present.  Reviewed safety with ADLs and IADLs, using reacher for LB ADLs.  He states he feels confident with discharge home.   Follow Up Recommendations  No OT follow up;Supervision - Intermittent    Equipment Recommendations  None recommended by OT    Recommendations for Other Services      Precautions / Restrictions Precautions Precautions: Back;Fall Precaution Booklet Issued: No Precaution Comments: Pt able to verbalize understanding of back precautions  Required Braces or Orthoses: Spinal Brace Spinal Brace: Lumbar corset;Applied in sitting position Restrictions Weight Bearing Restrictions: No       Mobility Bed Mobility Overal bed mobility: Needs Assistance Bed Mobility: Rolling;Sidelying to Sit;Sit to Sidelying Rolling: Supervision Sidelying to sit: Supervision       General bed mobility comments: Supervision to come to EOB  Transfers Overall transfer level: Needs assistance Equipment used: Rolling walker (2 wheeled) Transfers: Sit to/from Stand Sit to Stand: Min guard         General transfer comment: Cues for hand placement. Min guard for safety    Balance Overall balance assessment: Needs assistance Sitting-balance support: Feet supported;No upper extremity supported Sitting balance-Leahy Scale: Good     Standing balance support: Bilateral upper extremity supported Standing balance-Leahy Scale: Poor Standing balance comment: did not attempt due to pt ready for discharge.   Reviewed safety strategies to reduce risk of falls                            ADL either performed  or assessed with clinical judgement   ADL                                         General ADL Comments: Pt dressed sitting EOB awaiting discharge.  Pt reports he struggled to don his pants due to Lt LE weakness, and son assisted this am.  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 Instructed him to don Lt LE first.  He has a shower bench.  Instructed him to have family available to supervise shower transfer initially.   Discussed options for LB bathing.    Pt instructed to use walker bag or basket on walker, keep cell phone close, and to never attempt to carry an object and walk at the same time. Reviewed safe technique for household management.  He verbalized understanding of all.  He was able to verbalize correct technique for brushing teeth         Vision       Perception     Praxis      Cognition Arousal/Alertness: Awake/alert Behavior During Therapy: WFL for tasks assessed/performed Overall Cognitive Status: Within Functional Limits for tasks assessed                                          Exercises  Shoulder Instructions       General Comments      Pertinent Vitals/ Pain       Pain Assessment: 0-10 Faces Pain Scale: Hurts little more Pain Location: back Pain Descriptors / Indicators: Discomfort;Sore Pain Intervention(s): Monitored during session  Home Living                                          Prior Functioning/Environment              Frequency  Min 2X/week        Progress Toward Goals  OT Goals(current goals can now be found in the care plan section)     Acute Rehab OT Goals Patient Stated Goal: return home  Plan Discharge plan remains appropriate    Co-evaluation                 AM-PAC PT "6 Clicks" Daily Activity     Outcome Measure   Help from another person eating meals?: None Help from another person taking care of personal  grooming?: A Little Help from another person toileting, which includes using toliet, bedpan, or urinal?: A Little Help from another person bathing (including washing, rinsing, drying)?: A Little Help from another person to put on and taking off regular upper body clothing?: A Little Help from another person to put on and taking off regular lower body clothing?: A Little 6 Click Score: 19    End of Session Equipment Utilized During Treatment: Rolling walker  OT Visit Diagnosis: Unsteadiness on feet (R26.81);Pain   Activity Tolerance Patient tolerated treatment well   Patient Left in bed;with call bell/phone within reach;with family/visitor present(EOB)   Nurse Communication Mobility status;Other (comment)        Time:  -     Charges:    Lucille Passy, OTR/L [phone]    Lucille Passy M 03/22/2018, 12:51 PM

## 2018-03-22 NOTE — Progress Notes (Signed)
D/C instructions provided to patient, denies questions/concerns at this time. Patient transported to front entrance via [place], transfer to vehicle tol well.

## 2018-03-23 ENCOUNTER — Encounter (HOSPITAL_COMMUNITY): Payer: Self-pay | Admitting: Neurosurgery

## 2018-04-06 DIAGNOSIS — R000 Tachycardia, unspecified: Secondary | ICD-10-CM | POA: Diagnosis not present

## 2018-04-06 DIAGNOSIS — R55 Syncope and collapse: Secondary | ICD-10-CM | POA: Diagnosis not present

## 2018-04-06 DIAGNOSIS — I10 Essential (primary) hypertension: Secondary | ICD-10-CM

## 2018-04-06 DIAGNOSIS — E782 Mixed hyperlipidemia: Secondary | ICD-10-CM

## 2018-04-06 DIAGNOSIS — R0609 Other forms of dyspnea: Secondary | ICD-10-CM | POA: Diagnosis not present

## 2018-04-06 DIAGNOSIS — I951 Orthostatic hypotension: Secondary | ICD-10-CM | POA: Diagnosis not present

## 2018-04-06 DIAGNOSIS — M4322 Fusion of spine, cervical region: Secondary | ICD-10-CM | POA: Diagnosis not present

## 2018-04-06 DIAGNOSIS — M5136 Other intervertebral disc degeneration, lumbar region: Secondary | ICD-10-CM | POA: Diagnosis not present

## 2018-04-06 DIAGNOSIS — I2510 Atherosclerotic heart disease of native coronary artery without angina pectoris: Secondary | ICD-10-CM

## 2018-04-06 DIAGNOSIS — E1142 Type 2 diabetes mellitus with diabetic polyneuropathy: Secondary | ICD-10-CM

## 2018-04-06 DIAGNOSIS — R0602 Shortness of breath: Secondary | ICD-10-CM | POA: Diagnosis not present

## 2018-04-07 ENCOUNTER — Telehealth: Payer: Self-pay | Admitting: *Deleted

## 2018-04-07 DIAGNOSIS — R55 Syncope and collapse: Secondary | ICD-10-CM | POA: Diagnosis not present

## 2018-04-07 DIAGNOSIS — I951 Orthostatic hypotension: Secondary | ICD-10-CM | POA: Diagnosis not present

## 2018-04-07 DIAGNOSIS — I2510 Atherosclerotic heart disease of native coronary artery without angina pectoris: Secondary | ICD-10-CM | POA: Diagnosis not present

## 2018-04-07 DIAGNOSIS — E782 Mixed hyperlipidemia: Secondary | ICD-10-CM | POA: Diagnosis not present

## 2018-04-07 DIAGNOSIS — I10 Essential (primary) hypertension: Secondary | ICD-10-CM | POA: Diagnosis not present

## 2018-04-07 DIAGNOSIS — R0609 Other forms of dyspnea: Secondary | ICD-10-CM | POA: Diagnosis not present

## 2018-04-07 DIAGNOSIS — R0602 Shortness of breath: Secondary | ICD-10-CM | POA: Diagnosis not present

## 2018-04-07 DIAGNOSIS — M5136 Other intervertebral disc degeneration, lumbar region: Secondary | ICD-10-CM | POA: Diagnosis not present

## 2018-04-07 DIAGNOSIS — E1142 Type 2 diabetes mellitus with diabetic polyneuropathy: Secondary | ICD-10-CM | POA: Diagnosis not present

## 2018-04-07 DIAGNOSIS — R000 Tachycardia, unspecified: Secondary | ICD-10-CM | POA: Diagnosis not present

## 2018-04-07 NOTE — Telephone Encounter (Signed)
Request for last office notes and EKG on pt from [facility]. Faxed over on 04/07/18 notes and EKG from 02/25/18

## 2018-04-08 DIAGNOSIS — Z9889 Other specified postprocedural states: Secondary | ICD-10-CM | POA: Diagnosis not present

## 2018-04-08 DIAGNOSIS — Z79899 Other long term (current) drug therapy: Secondary | ICD-10-CM | POA: Diagnosis not present

## 2018-04-08 DIAGNOSIS — Z7982 Long term (current) use of aspirin: Secondary | ICD-10-CM | POA: Diagnosis not present

## 2018-04-08 DIAGNOSIS — N400 Enlarged prostate without lower urinary tract symptoms: Secondary | ICD-10-CM | POA: Diagnosis not present

## 2018-04-08 DIAGNOSIS — I10 Essential (primary) hypertension: Secondary | ICD-10-CM | POA: Diagnosis not present

## 2018-04-08 DIAGNOSIS — M5136 Other intervertebral disc degeneration, lumbar region: Secondary | ICD-10-CM | POA: Diagnosis not present

## 2018-04-08 DIAGNOSIS — R000 Tachycardia, unspecified: Secondary | ICD-10-CM | POA: Diagnosis not present

## 2018-04-08 DIAGNOSIS — I2510 Atherosclerotic heart disease of native coronary artery without angina pectoris: Secondary | ICD-10-CM | POA: Diagnosis not present

## 2018-04-08 DIAGNOSIS — Z955 Presence of coronary angioplasty implant and graft: Secondary | ICD-10-CM | POA: Diagnosis not present

## 2018-04-08 DIAGNOSIS — G8929 Other chronic pain: Secondary | ICD-10-CM | POA: Diagnosis not present

## 2018-04-08 DIAGNOSIS — R0602 Shortness of breath: Secondary | ICD-10-CM | POA: Diagnosis not present

## 2018-04-08 DIAGNOSIS — E782 Mixed hyperlipidemia: Secondary | ICD-10-CM | POA: Diagnosis not present

## 2018-04-08 DIAGNOSIS — G4733 Obstructive sleep apnea (adult) (pediatric): Secondary | ICD-10-CM | POA: Diagnosis not present

## 2018-04-08 DIAGNOSIS — R55 Syncope and collapse: Secondary | ICD-10-CM | POA: Diagnosis not present

## 2018-04-08 DIAGNOSIS — I252 Old myocardial infarction: Secondary | ICD-10-CM | POA: Diagnosis not present

## 2018-04-08 DIAGNOSIS — Z9989 Dependence on other enabling machines and devices: Secondary | ICD-10-CM | POA: Diagnosis not present

## 2018-04-08 DIAGNOSIS — Z7984 Long term (current) use of oral hypoglycemic drugs: Secondary | ICD-10-CM | POA: Diagnosis not present

## 2018-04-08 DIAGNOSIS — Z2821 Immunization not carried out because of patient refusal: Secondary | ICD-10-CM | POA: Diagnosis not present

## 2018-04-08 DIAGNOSIS — E1143 Type 2 diabetes mellitus with diabetic autonomic (poly)neuropathy: Secondary | ICD-10-CM | POA: Diagnosis not present

## 2018-04-08 DIAGNOSIS — I951 Orthostatic hypotension: Secondary | ICD-10-CM | POA: Diagnosis not present

## 2018-04-10 ENCOUNTER — Other Ambulatory Visit: Payer: Self-pay

## 2018-04-10 NOTE — Patient Outreach (Signed)
[facility]) Care Management  04/10/2018  RODEL GLASPY 1951-01-16 [id number]     Transition of Care Referral  Referral Date: 04/10/18 Referral Source: [facility] Discharge Report Date of Admission: unknown Diagnosis: unknown Date of Discharge: 04/09/18 Facility: [place]: [facility]   Referral received. No outreach warranted at this time. TOC will be completed by primary care provider office who will refer to [facility] care mgmt if needed.     Plan: RN CM will close case at this time.    Enzo Montgomery, RN,BSN,CCM [place] Management Telephonic Care Management Coordinator Direct Phone: [phone] Toll Free: [phone] Fax: [phone]

## 2018-04-13 DIAGNOSIS — I10 Essential (primary) hypertension: Secondary | ICD-10-CM | POA: Diagnosis not present

## 2018-04-13 DIAGNOSIS — M48061 Spinal stenosis, lumbar region without neurogenic claudication: Secondary | ICD-10-CM | POA: Diagnosis not present

## 2018-04-13 DIAGNOSIS — I951 Orthostatic hypotension: Secondary | ICD-10-CM | POA: Insufficient documentation
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
## 2018-04-20 ENCOUNTER — Ambulatory Visit (INDEPENDENT_AMBULATORY_CARE_PROVIDER_SITE_OTHER): Payer: PPO | Admitting: Cardiology

## 2018-04-20 ENCOUNTER — Encounter: Payer: Self-pay | Admitting: Cardiology

## 2018-04-20 VITALS — BP 112/68 | HR 87 | Ht 68.0 in | Wt 169.8 lb

## 2018-04-20 DIAGNOSIS — I10 Essential (primary) hypertension: Secondary | ICD-10-CM

## 2018-04-20 DIAGNOSIS — E785 Hyperlipidemia, unspecified: Secondary | ICD-10-CM

## 2018-04-20 DIAGNOSIS — I493 Ventricular premature depolarization: Secondary | ICD-10-CM | POA: Diagnosis not present

## 2018-04-20 DIAGNOSIS — I25119 Atherosclerotic heart disease of native coronary artery with unspecified angina pectoris: Secondary | ICD-10-CM

## 2018-04-20 DIAGNOSIS — I9589 Other hypotension: Secondary | ICD-10-CM | POA: Diagnosis not present

## 2018-04-20 LAB — BASIC METABOLIC PANEL
BUN/Creatinine Ratio: 9 — ABNORMAL LOW (ref 10–24)
BUN: 8 mg/dL (ref 8–27)
CO2: 24 mmol/L (ref 20–29)
CREATININE: 0.9 mg/dL (ref 0.76–1.27)
Calcium: 9.8 mg/dL (ref 8.6–10.2)
Chloride: 96 mmol/L (ref 96–106)
GFR calc Af Amer: 101 mL/min/{1.73_m2} (ref >59)
GFR calc non Af Amer: 87 mL/min/{1.73_m2} (ref >59)
GLUCOSE: 145 mg/dL — AB (ref 65–99)
POTASSIUM: 4.2 mmol/L (ref 3.5–5.2)
SODIUM: 138 mmol/L (ref 134–144)

## 2018-04-20 LAB — CORTISOL: CORTISOL: 17.3 ug/dL

## 2018-04-20 MED ORDER — FLUDROCORTISONE ACETATE 0.1 MG PO TABS: 0.1000 mg | ORAL_TABLET | Freq: Every day | ORAL | 3 refills | Status: DC

## 2018-04-20 NOTE — Patient Instructions (Addendum)
Medication Instructions:  Your physician recommends that you continue on your current medications as directed. Please refer to the Current Medication list given to you today.   Labwork: Your physician recommends that you return for lab work today: BMP, and cortisol level.   Testing/Procedures: None.  Follow-Up: Your physician recommends that you schedule a follow-up appointment in: 3 months.   Any Other Special Instructions Will Be Listed Below (If Applicable).     If you need a refill on your cardiac medications before your next appointment, please call your pharmacy.       Add salt to your diet.

## 2018-04-30 DIAGNOSIS — M48062 Spinal stenosis, lumbar region with neurogenic claudication: Secondary | ICD-10-CM | POA: Diagnosis not present

## 2018-05-11 DIAGNOSIS — I10 Essential (primary) hypertension: Secondary | ICD-10-CM | POA: Diagnosis not present

## 2018-05-11 DIAGNOSIS — E1165 Type 2 diabetes mellitus with hyperglycemia: Secondary | ICD-10-CM | POA: Diagnosis not present

## 2018-06-03 DIAGNOSIS — M48062 Spinal stenosis, lumbar region with neurogenic claudication: Secondary | ICD-10-CM | POA: Diagnosis not present

## 2018-06-10 DIAGNOSIS — M48062 Spinal stenosis, lumbar region with neurogenic claudication: Secondary | ICD-10-CM | POA: Diagnosis not present

## 2018-06-10 DIAGNOSIS — M6281 Muscle weakness (generalized): Secondary | ICD-10-CM | POA: Diagnosis not present

## 2018-06-10 DIAGNOSIS — R2689 Other abnormalities of gait and mobility: Secondary | ICD-10-CM | POA: Diagnosis not present

## 2018-06-10 DIAGNOSIS — M545 Low back pain: Secondary | ICD-10-CM | POA: Diagnosis not present

## 2018-07-02 DIAGNOSIS — I10 Essential (primary) hypertension: Secondary | ICD-10-CM | POA: Diagnosis not present

## 2018-07-02 DIAGNOSIS — Z6825 Body mass index (BMI) 25.0-25.9, adult: Secondary | ICD-10-CM | POA: Diagnosis not present

## 2018-07-02 DIAGNOSIS — M48062 Spinal stenosis, lumbar region with neurogenic claudication: Secondary | ICD-10-CM | POA: Diagnosis not present
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
## 2018-07-21 ENCOUNTER — Ambulatory Visit (INDEPENDENT_AMBULATORY_CARE_PROVIDER_SITE_OTHER): Payer: PPO | Admitting: Cardiology

## 2018-07-21 ENCOUNTER — Encounter: Payer: Self-pay | Admitting: Cardiology

## 2018-07-21 VITALS — BP 120/78 | HR 75 | Ht 68.0 in | Wt 167.4 lb

## 2018-07-21 DIAGNOSIS — I10 Essential (primary) hypertension: Secondary | ICD-10-CM | POA: Diagnosis not present

## 2018-07-21 DIAGNOSIS — I493 Ventricular premature depolarization: Secondary | ICD-10-CM

## 2018-07-21 DIAGNOSIS — R002 Palpitations: Secondary | ICD-10-CM | POA: Diagnosis not present

## 2018-07-21 DIAGNOSIS — E785 Hyperlipidemia, unspecified: Secondary | ICD-10-CM | POA: Diagnosis not present

## 2018-07-21 DIAGNOSIS — I9589 Other hypotension: Secondary | ICD-10-CM | POA: Diagnosis not present

## 2018-07-21 DIAGNOSIS — I25119 Atherosclerotic heart disease of native coronary artery with unspecified angina pectoris: Secondary | ICD-10-CM

## 2018-07-21 NOTE — Patient Instructions (Signed)
Medication Instructions:  Your physician has recommended you make the following change in your medication:   STOP fludrocortisone (florinef)   If you need a refill on your cardiac medications before your next appointment, please call your pharmacy.   Lab work: None  If you have labs (blood work) drawn today and your tests are completely normal, you will receive your results only by: Marland Kitchen MyChart Message (if you have MyChart) OR . A paper copy in the mail If you have any lab test that is abnormal or we need to change your treatment, we will call you to review the results.  Testing/Procedures: You had an EKG today.   Follow-Up: At [facility], you and your health needs are our priority.  As part of our continuing mission to provide you with exceptional heart care, we have created designated Provider Care Teams.  These Care Teams include your primary Cardiologist (physician) and Advanced Practice Providers (APPs -  Physician Assistants and Nurse Practitioners) who all work together to provide you with the care you need, when you need it. You will need a follow up appointment in 6 months.  Please call our office 2 months in advance to schedule this appointment.

## 2018-09-25 DIAGNOSIS — E782 Mixed hyperlipidemia: Secondary | ICD-10-CM | POA: Diagnosis not present

## 2018-09-25 DIAGNOSIS — E559 Vitamin D deficiency, unspecified: Secondary | ICD-10-CM | POA: Diagnosis not present

## 2018-09-25 DIAGNOSIS — Z1211 Encounter for screening for malignant neoplasm of colon: Secondary | ICD-10-CM | POA: Diagnosis not present

## 2018-09-25 DIAGNOSIS — E1165 Type 2 diabetes mellitus with hyperglycemia: Secondary | ICD-10-CM | POA: Diagnosis not present

## 2018-09-25 DIAGNOSIS — Z125 Encounter for screening for malignant neoplasm of prostate: Secondary | ICD-10-CM | POA: Diagnosis not present

## 2018-09-25 DIAGNOSIS — K219 Gastro-esophageal reflux disease without esophagitis: Secondary | ICD-10-CM | POA: Diagnosis not present

## 2018-09-25 DIAGNOSIS — I10 Essential (primary) hypertension: Secondary | ICD-10-CM | POA: Diagnosis not present

## 2018-09-25 DIAGNOSIS — Z7984 Long term (current) use of oral hypoglycemic drugs: Secondary | ICD-10-CM | POA: Diagnosis not present

## 2018-09-25 DIAGNOSIS — Z87891 Personal history of nicotine dependence: Secondary | ICD-10-CM | POA: Diagnosis not present

## 2018-09-25 DIAGNOSIS — Z0001 Encounter for general adult medical examination with abnormal findings: Secondary | ICD-10-CM | POA: Diagnosis not present

## 2018-09-28 DIAGNOSIS — G4733 Obstructive sleep apnea (adult) (pediatric): Secondary | ICD-10-CM | POA: Diagnosis not present

## 2018-10-01 DIAGNOSIS — M48062 Spinal stenosis, lumbar region with neurogenic claudication: Secondary | ICD-10-CM | POA: Diagnosis not present

## 2018-10-20 DIAGNOSIS — D1801 Hemangioma of skin and subcutaneous tissue: Secondary | ICD-10-CM | POA: Diagnosis not present

## 2018-10-20 DIAGNOSIS — L728 Other follicular cysts of the skin and subcutaneous tissue: Secondary | ICD-10-CM | POA: Diagnosis not present

## 2018-10-20 DIAGNOSIS — L820 Inflamed seborrheic keratosis: Secondary | ICD-10-CM | POA: Diagnosis not present

## 2018-10-28 DIAGNOSIS — J019 Acute sinusitis, unspecified: Secondary | ICD-10-CM | POA: Diagnosis not present

## 2018-12-29 DIAGNOSIS — G4733 Obstructive sleep apnea (adult) (pediatric): Secondary | ICD-10-CM | POA: Diagnosis not present

## 2018-12-31 DIAGNOSIS — M48062 Spinal stenosis, lumbar region with neurogenic claudication: Secondary | ICD-10-CM | POA: Diagnosis not present

## 2019-01-25 DIAGNOSIS — M48062 Spinal stenosis, lumbar region with neurogenic claudication: Secondary | ICD-10-CM | POA: Diagnosis not present

## 2019-01-26 DIAGNOSIS — R531 Weakness: Secondary | ICD-10-CM | POA: Diagnosis not present

## 2019-01-26 DIAGNOSIS — I2510 Atherosclerotic heart disease of native coronary artery without angina pectoris: Secondary | ICD-10-CM | POA: Diagnosis not present

## 2019-01-26 DIAGNOSIS — R29898 Other symptoms and signs involving the musculoskeletal system: Secondary | ICD-10-CM | POA: Diagnosis not present

## 2019-01-26 DIAGNOSIS — E1165 Type 2 diabetes mellitus with hyperglycemia: Secondary | ICD-10-CM | POA: Diagnosis not present

## 2019-01-27 DIAGNOSIS — R29898 Other symptoms and signs involving the musculoskeletal system: Secondary | ICD-10-CM | POA: Insufficient documentation

## 2019-01-28 DIAGNOSIS — M48062 Spinal stenosis, lumbar region with neurogenic claudication: Secondary | ICD-10-CM | POA: Diagnosis not present

## 2019-01-28 DIAGNOSIS — M5126 Other intervertebral disc displacement, lumbar region: Secondary | ICD-10-CM | POA: Diagnosis not present

## 2019-01-28 DIAGNOSIS — M48061 Spinal stenosis, lumbar region without neurogenic claudication: Secondary | ICD-10-CM | POA: Diagnosis not present

## 2019-01-28 DIAGNOSIS — Q7649 Other congenital malformations of spine, not associated with scoliosis: Secondary | ICD-10-CM | POA: Diagnosis not present

## 2019-02-03 DIAGNOSIS — M48062 Spinal stenosis, lumbar region with neurogenic claudication: Secondary | ICD-10-CM | POA: Diagnosis not present

## 2019-03-03 DIAGNOSIS — M48062 Spinal stenosis, lumbar region with neurogenic claudication: Secondary | ICD-10-CM | POA: Diagnosis not present

## 2019-03-13 DIAGNOSIS — K828 Other specified diseases of gallbladder: Secondary | ICD-10-CM | POA: Diagnosis not present

## 2019-03-13 DIAGNOSIS — R7989 Other specified abnormal findings of blood chemistry: Secondary | ICD-10-CM | POA: Diagnosis not present

## 2019-03-13 DIAGNOSIS — J811 Chronic pulmonary edema: Secondary | ICD-10-CM | POA: Diagnosis not present

## 2019-03-13 DIAGNOSIS — R51 Headache: Secondary | ICD-10-CM | POA: Diagnosis not present

## 2019-03-13 DIAGNOSIS — Z03818 Encounter for observation for suspected exposure to other biological agents ruled out: Secondary | ICD-10-CM | POA: Diagnosis not present

## 2019-03-13 DIAGNOSIS — R509 Fever, unspecified: Secondary | ICD-10-CM | POA: Diagnosis not present

## 2019-03-14 DIAGNOSIS — R7989 Other specified abnormal findings of blood chemistry: Secondary | ICD-10-CM | POA: Diagnosis not present

## 2019-03-14 DIAGNOSIS — K828 Other specified diseases of gallbladder: Secondary | ICD-10-CM | POA: Diagnosis not present

## 2019-03-14 DIAGNOSIS — R509 Fever, unspecified: Secondary | ICD-10-CM | POA: Diagnosis not present

## 2019-03-14 DIAGNOSIS — J811 Chronic pulmonary edema: Secondary | ICD-10-CM | POA: Diagnosis not present

## 2019-03-23 ENCOUNTER — Other Ambulatory Visit: Payer: Self-pay | Admitting: Neurosurgery

## 2019-03-23 DIAGNOSIS — Z6826 Body mass index (BMI) 26.0-26.9, adult: Secondary | ICD-10-CM | POA: Diagnosis not present

## 2019-03-23 DIAGNOSIS — I10 Essential (primary) hypertension: Secondary | ICD-10-CM | POA: Diagnosis not present

## 2019-03-23 DIAGNOSIS — M48062 Spinal stenosis, lumbar region with neurogenic claudication: Secondary | ICD-10-CM | POA: Diagnosis not present

## 2019-03-24 ENCOUNTER — Telehealth: Payer: Self-pay | Admitting: Cardiology

## 2019-03-24 NOTE — Telephone Encounter (Signed)
Called patient to inform him that the surgical clearance paperwork was received this morning and will be reviewed by Dr. Bettina Gavia as soon as possible. Patient states he recently had an ED visit at [facility]. Records pulled for Dr. Bettina Gavia to review. Patient advised that we would call when surgery clearance paperwork has been completed. Patient verbalized understanding. No further questions.

## 2019-03-24 NOTE — Telephone Encounter (Signed)
Patient called wanting to know status of surgical clearance paper from Dr Deri Fuelling, [facility]    He just got notification today that they want to do surgery Friday 03/26/2019 but they have to have clearance from [place].  Please call patient to discuss [phone]

## 2019-03-25 ENCOUNTER — Other Ambulatory Visit: Payer: Self-pay

## 2019-03-25 ENCOUNTER — Encounter (HOSPITAL_COMMUNITY): Payer: Self-pay | Admitting: *Deleted

## 2019-03-25 NOTE — Telephone Encounter (Signed)
Patient called and informed that Dr. Bettina Gavia has cleared him for surgery and the completed paperwork has been faxed back to appropriate office. Patient verbalized understanding. No further questions.

## 2019-03-25 NOTE — Progress Notes (Signed)
Spoke with pt for pre-op call. Pt has hx of MI with stents in 2011. Pt's cardiologist is Dr. Bettina Gavia and he states that Dr. Joya Gaskins office called today and "ok'ed" the surgery. Pt denies any recent chest pain or sob. Pt will be bringing his Cpap nose piece tomorrow. Pt is a type 2 diabetic. Last A1C was 7.0 on 09/25/18. Pt states his fasting blood sugar is usually between 149-159. Instructed pt to check his blood sugar when he gets up in the AM and every 2 hours until he leaves for the hospital. If blood sugar is 70 or below, treat with 1/2 cup of clear juice (apple or cranberry) and recheck blood sugar 15 minutes after drinking juice. If blood sugar continues to be 70 or below, call the Short Stay department and ask to speak to a nurse. Pt voiced understanding.  Pt will get his Covid test done in the AM prior to arrival for surgery.    Coronavirus Screening  Have you experienced the following symptoms:  Cough NO Fever (>100.4F)  NO Runny nose NO Sore throat NO Difficulty breathing/shortness of breath  NO  Have you or a family member traveled in the last 14 days and where? NO    Patient reminded that hospital visitation restrictions are in effect and the importance of the restrictions.

## 2019-03-26 ENCOUNTER — Inpatient Hospital Stay (HOSPITAL_COMMUNITY): Payer: PPO | Admitting: Anesthesiology

## 2019-03-26 ENCOUNTER — Encounter (HOSPITAL_COMMUNITY)
Admission: RE | Disposition: A | Discharge status: Home / Self Care | Payer: Self-pay | Source: Home / Self Care | Attending: Neurosurgery

## 2019-03-26 ENCOUNTER — Other Ambulatory Visit (HOSPITAL_COMMUNITY): Payer: Self-pay

## 2019-03-26 ENCOUNTER — Other Ambulatory Visit (HOSPITAL_COMMUNITY)
Admission: RE | Admit: 2019-03-26 | Discharge: 2019-03-26 | Disposition: A | Discharge status: Home / Self Care | Payer: PPO | Source: Ambulatory Visit | Attending: Neurosurgery | Admitting: Neurosurgery

## 2019-03-26 ENCOUNTER — Inpatient Hospital Stay (HOSPITAL_COMMUNITY)
Admission: RE | Admit: 2019-03-26 | Discharge: 2019-03-27 | DRG: 909 | Disposition: A | Discharge status: Home Health | Payer: PPO | Attending: Neurosurgery | Admitting: Neurosurgery

## 2019-03-26 ENCOUNTER — Encounter (HOSPITAL_COMMUNITY): Payer: Self-pay

## 2019-03-26 DIAGNOSIS — Z888 Allergy status to other drugs, medicaments and biological substances status: Secondary | ICD-10-CM

## 2019-03-26 DIAGNOSIS — M5416 Radiculopathy, lumbar region: Secondary | ICD-10-CM | POA: Diagnosis not present

## 2019-03-26 DIAGNOSIS — M48062 Spinal stenosis, lumbar region with neurogenic claudication: Secondary | ICD-10-CM | POA: Diagnosis present

## 2019-03-26 DIAGNOSIS — I2510 Atherosclerotic heart disease of native coronary artery without angina pectoris: Secondary | ICD-10-CM | POA: Diagnosis present

## 2019-03-26 DIAGNOSIS — Z7982 Long term (current) use of aspirin: Secondary | ICD-10-CM

## 2019-03-26 DIAGNOSIS — Z79899 Other long term (current) drug therapy: Secondary | ICD-10-CM | POA: Diagnosis not present

## 2019-03-26 DIAGNOSIS — Z7984 Long term (current) use of oral hypoglycemic drugs: Secondary | ICD-10-CM | POA: Diagnosis not present

## 2019-03-26 DIAGNOSIS — E1140 Type 2 diabetes mellitus with diabetic neuropathy, unspecified: Secondary | ICD-10-CM | POA: Diagnosis present

## 2019-03-26 DIAGNOSIS — G4730 Sleep apnea, unspecified: Secondary | ICD-10-CM | POA: Diagnosis present

## 2019-03-26 DIAGNOSIS — Z981 Arthrodesis status: Secondary | ICD-10-CM

## 2019-03-26 DIAGNOSIS — Z955 Presence of coronary angioplasty implant and graft: Secondary | ICD-10-CM | POA: Diagnosis not present

## 2019-03-26 DIAGNOSIS — F419 Anxiety disorder, unspecified: Secondary | ICD-10-CM | POA: Diagnosis present

## 2019-03-26 DIAGNOSIS — Z7951 Long term (current) use of inhaled steroids: Secondary | ICD-10-CM | POA: Diagnosis not present

## 2019-03-26 DIAGNOSIS — Z87891 Personal history of nicotine dependence: Secondary | ICD-10-CM

## 2019-03-26 DIAGNOSIS — T8484XA Pain due to internal orthopedic prosthetic devices, implants and grafts, initial encounter: Secondary | ICD-10-CM | POA: Diagnosis not present

## 2019-03-26 DIAGNOSIS — J302 Other seasonal allergic rhinitis: Secondary | ICD-10-CM | POA: Diagnosis present

## 2019-03-26 DIAGNOSIS — T85698A Other mechanical complication of other specified internal prosthetic devices, implants and grafts, initial encounter: Principal | ICD-10-CM | POA: Diagnosis present

## 2019-03-26 DIAGNOSIS — Z20828 Contact with and (suspected) exposure to other viral communicable diseases: Secondary | ICD-10-CM | POA: Diagnosis present

## 2019-03-26 DIAGNOSIS — Y838 Other surgical procedures as the cause of abnormal reaction of the patient, or of later complication, without mention of misadventure at the time of the procedure: Secondary | ICD-10-CM | POA: Diagnosis present

## 2019-03-26 DIAGNOSIS — I10 Essential (primary) hypertension: Secondary | ICD-10-CM | POA: Diagnosis present

## 2019-03-26 DIAGNOSIS — K219 Gastro-esophageal reflux disease without esophagitis: Secondary | ICD-10-CM | POA: Diagnosis present

## 2019-03-26 DIAGNOSIS — T84296A Other mechanical complication of internal fixation device of vertebrae, initial encounter: Secondary | ICD-10-CM | POA: Diagnosis not present

## 2019-03-26 DIAGNOSIS — I252 Old myocardial infarction: Secondary | ICD-10-CM | POA: Diagnosis not present

## 2019-03-26 DIAGNOSIS — I25119 Atherosclerotic heart disease of native coronary artery with unspecified angina pectoris: Secondary | ICD-10-CM | POA: Diagnosis not present

## 2019-03-26 HISTORY — PX: LUMBAR LAMINECTOMY/DECOMPRESSION MICRODISCECTOMY: SHX5026

## 2019-03-26 HISTORY — PX: HARDWARE REMOVAL: SHX979

## 2019-03-26 HISTORY — DX: Sleep apnea, unspecified: G47.30

## 2019-03-26 HISTORY — DX: Pneumonia, unspecified organism: J18.9

## 2019-03-26 HISTORY — DX: Anemia, unspecified: D64.9

## 2019-03-26 LAB — BASIC METABOLIC PANEL
Anion gap: 14 (ref 5–15)
BUN: 12 mg/dL (ref 8–23)
CO2: 23 mmol/L (ref 22–32)
Calcium: 10 mg/dL (ref 8.9–10.3)
Chloride: 99 mmol/L (ref 98–111)
Creatinine, Ser: 0.92 mg/dL (ref 0.61–1.24)
GFR calc Af Amer: >60 mL/min (ref >60)
GFR calc non Af Amer: >60 mL/min (ref >60)
Glucose, Bld: 169 mg/dL — ABNORMAL HIGH (ref 70–99)
Potassium: 4.1 mmol/L (ref 3.5–5.1)
Sodium: 136 mmol/L (ref 135–145)

## 2019-03-26 LAB — CBC WITH DIFFERENTIAL/PLATELET
Abs Immature Granulocytes: 0.02 10*3/uL (ref 0.00–0.07)
Basophils Absolute: 0 10*3/uL (ref 0.0–0.1)
Basophils Relative: 0 %
Eosinophils Absolute: 0.1 10*3/uL (ref 0.0–0.5)
Eosinophils Relative: 1 %
HCT: 39.6 % (ref 39.0–52.0)
Hemoglobin: 13.4 g/dL (ref 13.0–17.0)
Immature Granulocytes: 0 %
Lymphocytes Relative: 30 %
Lymphs Abs: 2.1 10*3/uL (ref 0.7–4.0)
MCH: 31.5 pg (ref 26.0–34.0)
MCHC: 33.8 g/dL (ref 30.0–36.0)
MCV: 93.2 fL (ref 80.0–100.0)
Monocytes Absolute: 0.7 10*3/uL (ref 0.1–1.0)
Monocytes Relative: 10 %
Neutro Abs: 4.1 10*3/uL (ref 1.7–7.7)
Neutrophils Relative %: 59 %
Platelets: 381 10*3/uL (ref 150–400)
RBC: 4.25 MIL/uL (ref 4.22–5.81)
RDW: 11.6 % (ref 11.5–15.5)
WBC: 7.1 10*3/uL (ref 4.0–10.5)
nRBC: 0 % (ref 0.0–0.2)

## 2019-03-26 LAB — GLUCOSE, CAPILLARY
Glucose-Capillary: 168 mg/dL — ABNORMAL HIGH (ref 70–99)
Glucose-Capillary: 161 mg/dL — ABNORMAL HIGH (ref 70–99)
Glucose-Capillary: 231 mg/dL — ABNORMAL HIGH (ref 70–99)
Glucose-Capillary: 251 mg/dL — ABNORMAL HIGH (ref 70–99)

## 2019-03-26 LAB — HEMOGLOBIN A1C
Hgb A1c MFr Bld: 8.1 % — ABNORMAL HIGH (ref 4.8–5.6)
Mean Plasma Glucose: 185.77 mg/dL

## 2019-03-26 LAB — SARS CORONAVIRUS 2 BY RT PCR (HOSPITAL ORDER, PERFORMED IN ~~LOC~~ HOSPITAL LAB): SARS Coronavirus 2: NEGATIVE

## 2019-03-26 SURGERY — LUMBAR LAMINECTOMY/DECOMPRESSION MICRODISCECTOMY 1 LEVEL
Anesthesia: General | Site: Back

## 2019-03-26 MED ORDER — HEMOSTATIC AGENTS (NO CHARGE) OPTIME
TOPICAL | Status: DC | PRN
  Administered 2019-03-26: 1 via TOPICAL

## 2019-03-26 MED ORDER — VITAMIN D (ERGOCALCIFEROL) 1.25 MG (50000 UNIT) PO CAPS: 50000.0000 [IU] | ORAL_CAPSULE | ORAL | Status: DC

## 2019-03-26 MED ORDER — KETOROLAC TROMETHAMINE 30 MG/ML IJ SOLN
INTRAMUSCULAR | Status: DC | PRN
  Administered 2019-03-26: 30 mg via INTRAVENOUS

## 2019-03-26 MED ORDER — ACETAMINOPHEN 325 MG PO TABS: 325.0000 mg | ORAL_TABLET | ORAL | Status: DC | PRN

## 2019-03-26 MED ORDER — SODIUM CHLORIDE 0.9% FLUSH: 3.0000 mL | INTRAVENOUS | Status: DC | PRN

## 2019-03-26 MED ORDER — ONDANSETRON HCL 4 MG/2ML IJ SOLN: 4.0000 mg | Freq: Four times a day (QID) | INTRAMUSCULAR | Status: DC | PRN

## 2019-03-26 MED ORDER — MENTHOL 3 MG MT LOZG: 1.0000 | LOZENGE | OROMUCOSAL | Status: DC | PRN

## 2019-03-26 MED ORDER — METHOCARBAMOL 500 MG PO TABS
500.0000 mg | ORAL_TABLET | Freq: Four times a day (QID) | ORAL | Status: DC | PRN
  Administered 2019-03-27: 09:00:00 500 mg via ORAL
  Filled 2019-03-26 (×2): qty 1

## 2019-03-26 MED ORDER — DEXAMETHASONE SODIUM PHOSPHATE 10 MG/ML IJ SOLN
INTRAMUSCULAR | Status: DC | PRN
  Administered 2019-03-26: 10 mg via INTRAVENOUS

## 2019-03-26 MED ORDER — HYDROCODONE-ACETAMINOPHEN 10-325 MG PO TABS
2.0000 | ORAL_TABLET | ORAL | Status: DC | PRN
  Administered 2019-03-26 – 2019-03-27 (×3): 2 via ORAL
  Filled 2019-03-26 (×3): qty 2

## 2019-03-26 MED ORDER — ONDANSETRON HCL 4 MG/2ML IJ SOLN
INTRAMUSCULAR | Status: DC | PRN
  Administered 2019-03-26: 4 mg via INTRAVENOUS

## 2019-03-26 MED ORDER — BUPIVACAINE HCL (PF) 0.25 % IJ SOLN
INTRAMUSCULAR | Status: AC
  Filled 2019-03-26: qty 30

## 2019-03-26 MED ORDER — CHLORHEXIDINE GLUCONATE CLOTH 2 % EX PADS: 6.0000 | MEDICATED_PAD | Freq: Once | CUTANEOUS | Status: DC

## 2019-03-26 MED ORDER — ZOLPIDEM TARTRATE 5 MG PO TABS: 10.0000 mg | ORAL_TABLET | Freq: Every day | ORAL | Status: DC

## 2019-03-26 MED ORDER — KETOROLAC TROMETHAMINE 15 MG/ML IJ SOLN
30.0000 mg | Freq: Four times a day (QID) | INTRAMUSCULAR | Status: DC
  Administered 2019-03-26 – 2019-03-27 (×3): 30 mg via INTRAVENOUS
  Filled 2019-03-26 (×3): qty 2

## 2019-03-26 MED ORDER — HYDROMORPHONE HCL 1 MG/ML IJ SOLN
1.0000 mg | INTRAMUSCULAR | Status: DC | PRN
  Administered 2019-03-26: 1 mg via INTRAVENOUS
  Filled 2019-03-26: qty 1

## 2019-03-26 MED ORDER — FENTANYL CITRATE (PF) 100 MCG/2ML IJ SOLN: 25.0000 ug | INTRAMUSCULAR | Status: DC | PRN

## 2019-03-26 MED ORDER — KETOROLAC TROMETHAMINE 30 MG/ML IJ SOLN
INTRAMUSCULAR | Status: AC
  Filled 2019-03-26: qty 1

## 2019-03-26 MED ORDER — ZOLPIDEM TARTRATE 5 MG PO TABS
5.0000 mg | ORAL_TABLET | Freq: Every day | ORAL | Status: DC
  Administered 2019-03-26: 5 mg via ORAL
  Filled 2019-03-26: qty 1

## 2019-03-26 MED ORDER — DEXAMETHASONE SODIUM PHOSPHATE 10 MG/ML IJ SOLN
10.0000 mg | Freq: Once | INTRAMUSCULAR | Status: DC
  Filled 2019-03-26: qty 1

## 2019-03-26 MED ORDER — LIDOCAINE 2% (20 MG/ML) 5 ML SYRINGE
INTRAMUSCULAR | Status: DC | PRN
  Administered 2019-03-26: 80 mg via INTRAVENOUS

## 2019-03-26 MED ORDER — ONDANSETRON HCL 4 MG/2ML IJ SOLN: 4.0000 mg | Freq: Once | INTRAMUSCULAR | Status: DC | PRN

## 2019-03-26 MED ORDER — FLUTICASONE PROPIONATE 50 MCG/ACT NA SUSP
1.0000 | Freq: Every day | NASAL | Status: DC | PRN
  Filled 2019-03-26: qty 16

## 2019-03-26 MED ORDER — SODIUM CHLORIDE 0.9 % IV SOLN
INTRAVENOUS | Status: DC | PRN
  Administered 2019-03-26: 13:00:00 500 mL

## 2019-03-26 MED ORDER — HYDROCODONE-ACETAMINOPHEN 5-325 MG PO TABS: 1.0000 | ORAL_TABLET | ORAL | Status: DC | PRN

## 2019-03-26 MED ORDER — CEFAZOLIN SODIUM-DEXTROSE 1-4 GM/50ML-% IV SOLN
1.0000 g | Freq: Three times a day (TID) | INTRAVENOUS | Status: AC
  Administered 2019-03-26 – 2019-03-27 (×2): 1 g via INTRAVENOUS
  Filled 2019-03-26 (×2): qty 50

## 2019-03-26 MED ORDER — INSULIN ASPART 100 UNIT/ML ~~LOC~~ SOLN
0.0000 [IU] | Freq: Three times a day (TID) | SUBCUTANEOUS | Status: DC
  Administered 2019-03-27: 08:00:00 8 [IU] via SUBCUTANEOUS

## 2019-03-26 MED ORDER — FENTANYL CITRATE (PF) 250 MCG/5ML IJ SOLN
INTRAMUSCULAR | Status: AC
  Filled 2019-03-26: qty 5

## 2019-03-26 MED ORDER — PREGABALIN 75 MG PO CAPS
75.0000 mg | ORAL_CAPSULE | Freq: Two times a day (BID) | ORAL | Status: DC
  Administered 2019-03-26 – 2019-03-27 (×2): 75 mg via ORAL
  Filled 2019-03-26 (×2): qty 1

## 2019-03-26 MED ORDER — FENTANYL CITRATE (PF) 250 MCG/5ML IJ SOLN
INTRAMUSCULAR | Status: DC | PRN
  Administered 2019-03-26: 100 ug via INTRAVENOUS
  Administered 2019-03-26 (×3): 50 ug via INTRAVENOUS

## 2019-03-26 MED ORDER — SODIUM CHLORIDE 0.9% FLUSH
3.0000 mL | Freq: Two times a day (BID) | INTRAVENOUS | Status: DC
  Administered 2019-03-26: 3 mL via INTRAVENOUS

## 2019-03-26 MED ORDER — SODIUM CHLORIDE 0.9 % IV SOLN: 250.0000 mL | INTRAVENOUS | Status: DC

## 2019-03-26 MED ORDER — SODIUM CHLORIDE 0.9 % IV SOLN
INTRAVENOUS | Status: DC | PRN
  Administered 2019-03-26: 14:00:00 15 ug/min via INTRAVENOUS

## 2019-03-26 MED ORDER — ASPIRIN EC 81 MG PO TBEC
81.0000 mg | DELAYED_RELEASE_TABLET | Freq: Every day | ORAL | Status: DC
  Administered 2019-03-26 – 2019-03-27 (×2): 81 mg via ORAL
  Filled 2019-03-26 (×2): qty 1

## 2019-03-26 MED ORDER — MEPERIDINE HCL 25 MG/ML IJ SOLN: 6.2500 mg | INTRAMUSCULAR | Status: DC | PRN

## 2019-03-26 MED ORDER — PRAVASTATIN SODIUM 10 MG PO TABS
10.0000 mg | ORAL_TABLET | Freq: Every day | ORAL | Status: DC
  Administered 2019-03-26: 10 mg via ORAL
  Filled 2019-03-26: qty 1

## 2019-03-26 MED ORDER — PROPOFOL 10 MG/ML IV BOLUS
INTRAVENOUS | Status: AC
  Filled 2019-03-26: qty 20

## 2019-03-26 MED ORDER — CEFAZOLIN SODIUM-DEXTROSE 2-4 GM/100ML-% IV SOLN
2.0000 g | INTRAVENOUS | Status: DC
  Filled 2019-03-26: qty 100

## 2019-03-26 MED ORDER — PHENOL 1.4 % MT LIQD: 1.0000 | OROMUCOSAL | Status: DC | PRN

## 2019-03-26 MED ORDER — INSULIN ASPART 100 UNIT/ML ~~LOC~~ SOLN
0.0000 [IU] | Freq: Three times a day (TID) | SUBCUTANEOUS | Status: DC
  Administered 2019-03-26: 5 [IU] via SUBCUTANEOUS

## 2019-03-26 MED ORDER — ALPRAZOLAM 0.25 MG PO TABS
0.2500 mg | ORAL_TABLET | Freq: Three times a day (TID) | ORAL | Status: DC | PRN
  Administered 2019-03-27: 03:00:00 0.25 mg via ORAL
  Filled 2019-03-26: qty 1

## 2019-03-26 MED ORDER — METFORMIN HCL 500 MG PO TABS
1000.0000 mg | ORAL_TABLET | Freq: Two times a day (BID) | ORAL | Status: DC
  Administered 2019-03-26 – 2019-03-27 (×2): 1000 mg via ORAL
  Filled 2019-03-26 (×2): qty 2

## 2019-03-26 MED ORDER — OXYCODONE HCL 5 MG PO TABS
ORAL_TABLET | ORAL | Status: AC
  Filled 2019-03-26: qty 1

## 2019-03-26 MED ORDER — ACETAMINOPHEN 10 MG/ML IV SOLN
INTRAVENOUS | Status: AC
  Filled 2019-03-26: qty 100

## 2019-03-26 MED ORDER — ACETAMINOPHEN 325 MG PO TABS: 650.0000 mg | ORAL_TABLET | ORAL | Status: DC | PRN

## 2019-03-26 MED ORDER — HYDROCODONE-ACETAMINOPHEN 10-325 MG PO TABS: 1.0000 | ORAL_TABLET | ORAL | Status: DC | PRN

## 2019-03-26 MED ORDER — INSULIN ASPART 100 UNIT/ML ~~LOC~~ SOLN
0.0000 [IU] | Freq: Every day | SUBCUTANEOUS | Status: DC
  Administered 2019-03-26: 2 [IU] via SUBCUTANEOUS

## 2019-03-26 MED ORDER — LACTATED RINGERS IV SOLN
INTRAVENOUS | Status: DC
  Administered 2019-03-26: 11:00:00 via INTRAVENOUS

## 2019-03-26 MED ORDER — THROMBIN 5000 UNITS EX SOLR
CUTANEOUS | Status: AC
  Filled 2019-03-26: qty 10000

## 2019-03-26 MED ORDER — PANTOPRAZOLE SODIUM 40 MG PO TBEC
40.0000 mg | DELAYED_RELEASE_TABLET | Freq: Every day | ORAL | Status: DC
  Administered 2019-03-27: 09:00:00 40 mg via ORAL
  Filled 2019-03-26: qty 1

## 2019-03-26 MED ORDER — 0.9 % SODIUM CHLORIDE (POUR BTL) OPTIME
TOPICAL | Status: DC | PRN
  Administered 2019-03-26: 13:00:00 1000 mL

## 2019-03-26 MED ORDER — ACETAMINOPHEN 160 MG/5ML PO SOLN: 325.0000 mg | ORAL | Status: DC | PRN

## 2019-03-26 MED ORDER — ROCURONIUM BROMIDE 10 MG/ML (PF) SYRINGE
PREFILLED_SYRINGE | INTRAVENOUS | Status: DC | PRN
  Administered 2019-03-26: 50 mg via INTRAVENOUS
  Administered 2019-03-26: 20 mg via INTRAVENOUS

## 2019-03-26 MED ORDER — OXYCODONE HCL 5 MG PO TABS
5.0000 mg | ORAL_TABLET | Freq: Once | ORAL | Status: AC | PRN
  Administered 2019-03-26: 5 mg via ORAL

## 2019-03-26 MED ORDER — MIDAZOLAM HCL 2 MG/2ML IJ SOLN
INTRAMUSCULAR | Status: AC
  Filled 2019-03-26: qty 2

## 2019-03-26 MED ORDER — ONDANSETRON HCL 4 MG PO TABS: 4.0000 mg | ORAL_TABLET | Freq: Four times a day (QID) | ORAL | Status: DC | PRN

## 2019-03-26 MED ORDER — MIDAZOLAM HCL 2 MG/2ML IJ SOLN
INTRAMUSCULAR | Status: DC | PRN
  Administered 2019-03-26: 2 mg via INTRAVENOUS

## 2019-03-26 MED ORDER — ACETAMINOPHEN 10 MG/ML IV SOLN
INTRAVENOUS | Status: DC | PRN
  Administered 2019-03-26: 1000 mg via INTRAVENOUS

## 2019-03-26 MED ORDER — OXYCODONE HCL 5 MG/5ML PO SOLN: 5.0000 mg | Freq: Once | ORAL | Status: AC | PRN

## 2019-03-26 MED ORDER — SUGAMMADEX SODIUM 200 MG/2ML IV SOLN
INTRAVENOUS | Status: DC | PRN
  Administered 2019-03-26: 200 mg via INTRAVENOUS

## 2019-03-26 MED ORDER — THROMBIN 5000 UNITS EX SOLR
CUTANEOUS | Status: DC | PRN
  Administered 2019-03-26 (×2): 5000 [IU] via TOPICAL

## 2019-03-26 MED ORDER — VITAMIN B-12 1000 MCG PO TABS
5000.0000 ug | ORAL_TABLET | Freq: Every day | ORAL | Status: DC
  Filled 2019-03-26: qty 5

## 2019-03-26 MED ORDER — ACETAMINOPHEN 650 MG RE SUPP: 650.0000 mg | RECTAL | Status: DC | PRN

## 2019-03-26 MED ORDER — PROPOFOL 10 MG/ML IV BOLUS
INTRAVENOUS | Status: DC | PRN
  Administered 2019-03-26: 150 mg via INTRAVENOUS

## 2019-03-26 SURGICAL SUPPLY — 49 items
BAG DECANTER FOR FLEXI CONT (MISCELLANEOUS) ×3 IMPLANT
BENZOIN TINCTURE PRP APPL 2/3 (GAUZE/BANDAGES/DRESSINGS) ×3 IMPLANT
BLADE CLIPPER SURG (BLADE) ×3 IMPLANT
BUR CUTTER 7.0 ROUND (BURR) ×3 IMPLANT
CANISTER SUCT 3000ML PPV (MISCELLANEOUS) ×3 IMPLANT
CARTRIDGE OIL MAESTRO DRILL (MISCELLANEOUS) IMPLANT
CLOSURE WOUND 1/2 X4 (GAUZE/BANDAGES/DRESSINGS) ×1
COVER WAND RF STERILE (DRAPES) ×3 IMPLANT
DECANTER SPIKE VIAL GLASS SM (MISCELLANEOUS) ×3 IMPLANT
DERMABOND ADVANCED (GAUZE/BANDAGES/DRESSINGS) ×2
DERMABOND ADVANCED .7 DNX12 (GAUZE/BANDAGES/DRESSINGS) ×1 IMPLANT
DIFFUSER DRILL AIR PNEUMATIC (MISCELLANEOUS) IMPLANT
DRAPE HALF SHEET 40X57 (DRAPES) IMPLANT
DRAPE LAPAROTOMY 100X72X124 (DRAPES) ×3 IMPLANT
DRAPE MICROSCOPE LEICA (MISCELLANEOUS) IMPLANT
DRAPE POUCH INSTRU U-SHP 10X18 (DRAPES) IMPLANT
DRAPE SURG 17X23 STRL (DRAPES) ×12 IMPLANT
DRSG OPSITE POSTOP 4X6 (GAUZE/BANDAGES/DRESSINGS) ×3 IMPLANT
DURAPREP 26ML APPLICATOR (WOUND CARE) ×3 IMPLANT
ELECT REM PT RETURN 9FT ADLT (ELECTROSURGICAL) ×6
ELECTRODE REM PT RTRN 9FT ADLT (ELECTROSURGICAL) ×2 IMPLANT
GAUZE 4X4 16PLY RFD (DISPOSABLE) IMPLANT
GAUZE SPONGE 4X4 12PLY STRL (GAUZE/BANDAGES/DRESSINGS) IMPLANT
GLOVE ECLIPSE 9.0 STRL (GLOVE) ×3 IMPLANT
GLOVE EXAM NITRILE XL STR (GLOVE) IMPLANT
GLOVE SURG SS PI 6.0 STRL IVOR (GLOVE) ×3 IMPLANT
GLOVE SURG SS PI 7.5 STRL IVOR (GLOVE) ×3 IMPLANT
GOWN STRL REUS W/ TWL LRG LVL3 (GOWN DISPOSABLE) ×2 IMPLANT
GOWN STRL REUS W/ TWL XL LVL3 (GOWN DISPOSABLE) ×1 IMPLANT
GOWN STRL REUS W/TWL 2XL LVL3 (GOWN DISPOSABLE) IMPLANT
GOWN STRL REUS W/TWL LRG LVL3 (GOWN DISPOSABLE) ×4
GOWN STRL REUS W/TWL XL LVL3 (GOWN DISPOSABLE) ×2
KIT BASIN OR (CUSTOM PROCEDURE TRAY) ×3 IMPLANT
KIT TURNOVER KIT B (KITS) ×3 IMPLANT
NEEDLE HYPO 22GX1.5 SAFETY (NEEDLE) ×3 IMPLANT
NEEDLE SPNL 22GX3.5 QUINCKE BK (NEEDLE) IMPLANT
NS IRRIG 1000ML POUR BTL (IV SOLUTION) ×3 IMPLANT
OIL CARTRIDGE MAESTRO DRILL (MISCELLANEOUS)
PACK LAMINECTOMY NEURO (CUSTOM PROCEDURE TRAY) ×3 IMPLANT
RASP 3.0MM (RASP) ×3 IMPLANT
RUBBERBAND STERILE (MISCELLANEOUS) ×6 IMPLANT
SPONGE SURGIFOAM ABS GEL 100 (HEMOSTASIS) IMPLANT
SPONGE SURGIFOAM ABS GEL SZ50 (HEMOSTASIS) ×3 IMPLANT
STRIP CLOSURE SKIN 1/2X4 (GAUZE/BANDAGES/DRESSINGS) ×2 IMPLANT
SUT VIC AB 2-0 CT1 18 (SUTURE) ×3 IMPLANT
SUT VIC AB 3-0 SH 8-18 (SUTURE) ×3 IMPLANT
TOWEL GREEN STERILE (TOWEL DISPOSABLE) IMPLANT
TOWEL GREEN STERILE FF (TOWEL DISPOSABLE) ×3 IMPLANT
WATER STERILE IRR 1000ML POUR (IV SOLUTION) ×3 IMPLANT

## 2019-03-26 NOTE — Brief Op Note (Signed)
03/26/2019  3:16 PM  PATIENT:  Bryan Mcguire  68 y.o. male  PRE-OPERATIVE DIAGNOSIS:  Stenosis  POST-OPERATIVE DIAGNOSIS:  * No post-op diagnosis entered *  PROCEDURE:  Procedure(s): Laminectomy and Foraminotomy - Lumbar two-Lumbar three (N/A) Removal Lumbar two pedicle screw (N/A)  SURGEON:  Surgeon(s) and Role:    Earnie Larsson, MD - Primary  PHYSICIAN ASSISTANT:   ASSISTANTSLucia Gaskins   ANESTHESIA:   general  EBL:  200 mL   BLOOD ADMINISTERED:none  DRAINS: none   LOCAL MEDICATIONS USED:  MARCAINE     SPECIMEN:  No Specimen  DISPOSITION OF SPECIMEN:  N/A  COUNTS:  YES  TOURNIQUET:  * No tourniquets in log *  DICTATION: .Dragon Dictation  PLAN OF CARE: Admit for overnight observation  PATIENT DISPOSITION:  PACU - hemodynamically stable.   Delay start of Pharmacological VTE agent (>24hrs) due to surgical blood loss or risk of bleeding: yes

## 2019-03-26 NOTE — Progress Notes (Deleted)
RN states pt does not want to wear CPAP.

## 2019-03-26 NOTE — Anesthesia Procedure Notes (Signed)
Procedure Name: Intubation Date/Time: 03/26/2019 1:35 PM Performed by: Clearnce Sorrel, CRNA Pre-anesthesia Checklist: Patient identified, Emergency Drugs available, Suction available, Patient being monitored and Timeout performed Patient Re-evaluated:Patient Re-evaluated prior to induction Oxygen Delivery Method: Circle system utilized Preoxygenation: Pre-oxygenation with 100% oxygen Induction Type: IV induction Ventilation: Oral airway inserted - appropriate to patient size and Mask ventilation without difficulty Laryngoscope Size: Mac and 4 Grade View: Grade I Tube size: 7.5 mm Number of attempts: 1 Airway Equipment and Method: Stylet Placement Confirmation: ETT inserted through vocal cords under direct vision,  positive ETCO2 and breath sounds checked- equal and bilateral Secured at: 22 cm Tube secured with: Tape Dental Injury: Teeth and Oropharynx as per pre-operative assessment
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
## 2019-03-26 NOTE — Anesthesia Preprocedure Evaluation (Signed)
Anesthesia Evaluation  Patient identified by MRN, date of birth, ID band Patient awake    Reviewed: Allergy & Precautions, H&P , NPO status , Patient's Chart, lab work & pertinent test results, reviewed documented beta blocker date and time   Airway Mallampati: III  TM Distance: >3 FB Neck ROM: Full    Dental no notable dental hx. (+) Upper Dentures, Dental Advisory Given   Pulmonary sleep apnea , former smoker,    Pulmonary exam normal breath sounds clear to auscultation       Cardiovascular hypertension, On Medications and On Home Beta Blockers + CAD, + Past MI and + Cardiac Stents   Rhythm:Regular Rate:Normal     Neuro/Psych Anxiety negative neurological ROS     GI/Hepatic GERD  Medicated and Controlled,  Endo/Other  negative endocrine ROSdiabetes, Oral Hypoglycemic Agents  Renal/GU   negative genitourinary   Musculoskeletal  (+) Arthritis , Osteoarthritis,    Abdominal   Peds  Hematology   Anesthesia Other Findings   Reproductive/Obstetrics negative OB ROS                             Anesthesia Physical  Anesthesia Plan  ASA: III  Anesthesia Plan: General   Post-op Pain Management:    Induction: Intravenous  PONV Risk Score and Plan: 2 and Ondansetron and Treatment may vary due to age or medical condition  Airway Management Planned: Oral ETT  Additional Equipment:   Intra-op Plan:   Post-operative Plan: Extubation in OR  Informed Consent: I have reviewed the patients History and Physical, chart, labs and discussed the procedure including the risks, benefits and alternatives for the proposed anesthesia with the patient or authorized representative who has indicated his/her understanding and acceptance.     Dental advisory given  Plan Discussed with: CRNA, Anesthesiologist and Surgeon  Anesthesia Plan Comments: (  )        Anesthesia Quick Evaluation

## 2019-03-26 NOTE — Transfer of Care (Signed)
Immediate Anesthesia Transfer of Care Note  Patient: Bryan Mcguire  Procedure(s) Performed: Laminectomy and Foraminotomy - Lumbar two-Lumbar three (N/A Back) Removal Lumbar two pedicle screw (N/A Back)  Patient Location: PACU  Anesthesia Type:General  Level of Consciousness: awake, alert  and oriented  Airway & Oxygen Therapy: Patient Spontanous Breathing and Patient connected to face mask oxygen  Post-op Assessment: Report given to RN and Post -op Vital signs reviewed and stable  Post vital signs: Reviewed and stable  Last Vitals:  Vitals Value Taken Time  BP 172/91 03/26/19 1539  Temp    Pulse 66 03/26/19 1542  Resp 31 03/26/19 1542  SpO2 100 % 03/26/19 1542  Vitals shown include unvalidated device data.  Last Pain:  Vitals:   03/26/19 1029  TempSrc:   PainSc: 3       Patients Stated Pain Goal: 1 (85/88/50 2774)  Complications: No apparent anesthesia complications

## 2019-03-26 NOTE — Op Note (Signed)
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  Asst. Surgeon: Reinaldo Meeker, NP  Anesthesia: General  Indication: 68 year old male remotely status post L2-L5 anterior lateral interbody decompression and fusion with posterior percutaneous pedicle screw fixation.  Patient presents with subacute back pain with radiation in both lower extremities with progressive sensory loss and some weakness.  Work-up demonstrates evidence of worsening stenosis at L2-3.  There is no frank evidence of pseudoarthrosis however his left L2 pedicle screw has loosened.  Interbody cages well-positioned.  There appears to be lateral bridging bone on CT scanning.  Patient presents now for hardware removal and bilateral L2-3 decompressive surgery in hopes of improving his symptoms.  Operative note: After induction of anesthesia, patient position prone onto Wilson frame and properly padded.  Lumbar region prepped and draped sterilely.  Incision made overlying L2-3.  Dissection performed bilaterally.  Previously placed left-sided L2 pedicle screw was dissected free.  Position was confirmed.  The rod distal to the L2 screw was cut using a titanium cutting drill bit.  The short segment of rod and the screw at L2 was removed.  The construct distal to this appears stable and solid.  There was no evidence of gross instability at L2-3.  Bilateral decompressive laminotomy was then performed using high-speed drill and Kerrison rongeurs to remove the inferior aspect of the lamina of L2 the medial aspect of the L2-3 facet joint and the superior  rim of the L3 lamina.  Ligament flavum was elevated and resected.  Foraminotomies were completed on the course exiting L2 and L3 nerve roots bilaterally.  At this point a very thorough decompression had been achieved.  There was no evidence of injury to the thecal sac or nerve roots.  Wound was then irrigated one final time.  Gelfoam was placed topically for hemostasis.  Wound is then closed in layers of Vicryl sutures.  Steri-Strips and sterile dressing were applied.  No apparent complications.  Patient tolerated the procedure well and he returns to the recovery room postop.

## 2019-03-27 DIAGNOSIS — Z87891 Personal history of nicotine dependence: Secondary | ICD-10-CM | POA: Diagnosis not present

## 2019-03-27 DIAGNOSIS — I252 Old myocardial infarction: Secondary | ICD-10-CM | POA: Diagnosis not present

## 2019-03-27 DIAGNOSIS — Z981 Arthrodesis status: Secondary | ICD-10-CM | POA: Diagnosis not present

## 2019-03-27 DIAGNOSIS — Z7984 Long term (current) use of oral hypoglycemic drugs: Secondary | ICD-10-CM | POA: Diagnosis not present

## 2019-03-27 DIAGNOSIS — K219 Gastro-esophageal reflux disease without esophagitis: Secondary | ICD-10-CM | POA: Diagnosis present

## 2019-03-27 DIAGNOSIS — Y838 Other surgical procedures as the cause of abnormal reaction of the patient, or of later complication, without mention of misadventure at the time of the procedure: Secondary | ICD-10-CM | POA: Diagnosis present

## 2019-03-27 DIAGNOSIS — Z7951 Long term (current) use of inhaled steroids: Secondary | ICD-10-CM | POA: Diagnosis not present

## 2019-03-27 DIAGNOSIS — J302 Other seasonal allergic rhinitis: Secondary | ICD-10-CM | POA: Diagnosis present

## 2019-03-27 DIAGNOSIS — I2510 Atherosclerotic heart disease of native coronary artery without angina pectoris: Secondary | ICD-10-CM | POA: Diagnosis present

## 2019-03-27 DIAGNOSIS — M48062 Spinal stenosis, lumbar region with neurogenic claudication: Secondary | ICD-10-CM | POA: Diagnosis present

## 2019-03-27 DIAGNOSIS — F419 Anxiety disorder, unspecified: Secondary | ICD-10-CM | POA: Diagnosis present

## 2019-03-27 DIAGNOSIS — Z79899 Other long term (current) drug therapy: Secondary | ICD-10-CM | POA: Diagnosis not present

## 2019-03-27 DIAGNOSIS — Z20828 Contact with and (suspected) exposure to other viral communicable diseases: Secondary | ICD-10-CM | POA: Diagnosis present

## 2019-03-27 DIAGNOSIS — T85698A Other mechanical complication of other specified internal prosthetic devices, implants and grafts, initial encounter: Secondary | ICD-10-CM | POA: Diagnosis present

## 2019-03-27 DIAGNOSIS — Z7982 Long term (current) use of aspirin: Secondary | ICD-10-CM | POA: Diagnosis not present

## 2019-03-27 DIAGNOSIS — Z955 Presence of coronary angioplasty implant and graft: Secondary | ICD-10-CM | POA: Diagnosis not present

## 2019-03-27 DIAGNOSIS — G4730 Sleep apnea, unspecified: Secondary | ICD-10-CM | POA: Diagnosis present

## 2019-03-27 DIAGNOSIS — E1140 Type 2 diabetes mellitus with diabetic neuropathy, unspecified: Secondary | ICD-10-CM | POA: Diagnosis present

## 2019-03-27 DIAGNOSIS — I10 Essential (primary) hypertension: Secondary | ICD-10-CM | POA: Diagnosis present

## 2019-03-27 DIAGNOSIS — Z888 Allergy status to other drugs, medicaments and biological substances status: Secondary | ICD-10-CM | POA: Diagnosis not present

## 2019-03-27 MED ORDER — HYDROCODONE-ACETAMINOPHEN 10-325 MG PO TABS: 2.0000 | ORAL_TABLET | ORAL | 0 refills | Status: DC | PRN

## 2019-03-27 NOTE — Progress Notes (Signed)
Neurosurgery Service Progress Note  Subjective: No acute events overnight, no radicular symptoms   Objective: Vitals:   03/26/19 1938 03/26/19 2310 03/27/19 0305 03/27/19 0730  BP: 110/69 101/68 99/68 107/74  Pulse: 72 62 63 65  Resp: 18   18  Temp: 98.1 F (36.7 C) 97.9 F (36.6 C) 98.2 F (36.8 C) 98.6 F (37 C)  TempSrc: Oral Oral Oral Oral  SpO2: 99% 98% 96% 98%  Weight:      Height:       Temp (24hrs), Avg:98 F (36.7 C), Min:97.2 F (36.2 C), Max:98.6 F (37 C)  CBC Latest Ref Rng & Units 03/26/2019 03/20/2018 07/31/2012  WBC 4.0 - 10.5 K/uL 7.1 6.8 -  Hemoglobin 13.0 - 17.0 g/dL 13.4 13.9 14.9  Hematocrit 39.0 - 52.0 % 39.6 41.6 -  Platelets 150 - 400 K/uL 381 243 -   BMP Latest Ref Rng & Units 03/26/2019 04/20/2018 03/20/2018  Glucose 70 - 99 mg/dL 169(H) 145(H) 186(H)  BUN 8 - 23 mg/dL 12 8 16   Creatinine 0.61 - 1.24 mg/dL 0.92 0.90 1.21  BUN/Creat Ratio 10 - 24 - 9(L) -  Sodium 135 - 145 mmol/L 136 138 138  Potassium 3.5 - 5.1 mmol/L 4.1 4.2 5.2(H)  Chloride 98 - 111 mmol/L 99 96 102  CO2 22 - 32 mmol/L 23 24 27   Calcium 8.9 - 10.3 mg/dL 10.0 9.8 9.8    Intake/Output Summary (Last 24 hours) at 03/27/2019 0824 Last data filed at 03/26/2019 1600 Gross per 24 hour  Intake 600 ml  Output 350 ml  Net 250 ml    Current Facility-Administered Medications:  .  0.9 %  sodium chloride infusion, 250 mL, Intravenous, Continuous, Pool, Henry, MD .  acetaminophen (TYLENOL) tablet 650 mg, 650 mg, Oral, Q4H PRN **OR** acetaminophen (TYLENOL) suppository 650 mg, 650 mg, Rectal, Q4H PRN, Earnie Larsson, MD .  ALPRAZolam Duanne Moron) tablet 0.25 mg, 0.25 mg, Oral, TID PRN, Earnie Larsson, MD, 0.25 mg at 03/27/19 0311 .  aspirin EC tablet 81 mg, 81 mg, Oral, Daily, Earnie Larsson, MD, 81 mg at 03/26/19 1809 .  fluticasone (FLONASE) 50 MCG/ACT nasal spray 1-2 spray, 1-2 spray, Each Nare, Daily PRN, Pool, Mallie Mussel, MD .  HYDROcodone-acetaminophen (NORCO) 10-325 MG per tablet 1 tablet, 1 tablet,  Oral, Q4H PRN, Earnie Larsson, MD .  HYDROcodone-acetaminophen [facility]) 10-325 MG per tablet 2 tablet, 2 tablet, Oral, Q4H PRN, Earnie Larsson, MD, 2 tablet at 03/27/19 0124 .  HYDROmorphone (DILAUDID) injection 1 mg, 1 mg, Intravenous, Q2H PRN, Earnie Larsson, MD, 1 mg at 03/26/19 1810 .  insulin aspart (novoLOG) injection 0-15 Units, 0-15 Units, Subcutaneous, TID WC, Earnie Larsson, MD, 8 Units at 03/27/19 [phone] .  insulin aspart (novoLOG) injection 0-5 Units, 0-5 Units, Subcutaneous, QHS, Earnie Larsson, MD, 2 Units at 03/26/19 2120 .  ketorolac (TORADOL) 15 MG/ML injection 30 mg, 30 mg, Intravenous, Q6H, Pool, Mallie Mussel, MD, 30 mg at 03/27/19 0631 .  menthol-cetylpyridinium (CEPACOL) lozenge 3 mg, 1 lozenge, Oral, PRN **OR** phenol (CHLORASEPTIC) mouth spray 1 spray, 1 spray, Mouth/Throat, PRN, Pool, Henry, MD .  metFORMIN (GLUCOPHAGE) tablet 1,000 mg, 1,000 mg, Oral, BID WC, Pool, Mallie Mussel, MD, 1,000 mg at 03/26/19 1759 .  methocarbamol (ROBAXIN) tablet 500 mg, 500 mg, Oral, QID PRN, Earnie Larsson, MD .  ondansetron (ZOFRAN) tablet 4 mg, 4 mg, Oral, Q6H PRN **OR** ondansetron (ZOFRAN) injection 4 mg, 4 mg, Intravenous, Q6H PRN, Pool, Henry, MD .  pantoprazole (PROTONIX) EC tablet 40 mg, 40 mg,  Oral, Daily, Pool, Mallie Mussel, MD .  pravastatin (PRAVACHOL) tablet 10 mg, 10 mg, Oral, Q supper, Earnie Larsson, MD, 10 mg at 03/26/19 1759 .  pregabalin (LYRICA) capsule 75 mg, 75 mg, Oral, BID, Earnie Larsson, MD, 75 mg at 03/26/19 2120 .  sodium chloride flush (NS) 0.9 % injection 3 mL, 3 mL, Intravenous, Q12H, Earnie Larsson, MD, 3 mL at 03/26/19 2208 .  sodium chloride flush (NS) 0.9 % injection 3 mL, 3 mL, Intravenous, PRN, Pool, Mallie Mussel, MD .  vitamin B-12 (CYANOCOBALAMIN) tablet 5,000 mcg, 5,000 mcg, Oral, Daily, Pool, Mallie Mussel, MD .  Derrill Memo ON 03/30/2019] Vitamin D (Ergocalciferol) (DRISDOL) capsule 50,000 Units, 50,000 Units, Oral, Q Tue, Pool, Mallie Mussel, MD .  zolpidem (AMBIEN) tablet 5 mg, 5 mg, Oral, QHS, [place], Yujing Z, RPH, 5 mg at  03/26/19 2120   Physical Exam: AOx3, PERRL, EOMI, FS, Strength 5/5 x4, SILTx4  Assessment & Plan: 68 y.o. man s/p lumbar laminectomy, recovering well. -discharge home today  Judith Part  03/27/19 8:24 AM

## 2019-03-27 NOTE — Evaluation (Signed)
Occupational Therapy Evaluation Patient Details Name: Bryan Mcguire MRN: [id number] DOB: February 27, 1951 Today's Date: 03/27/2019    History of Present Illness 68 yo male s/p L2-5 decompression and fusion. PMH including anxiety, arthritis, CAD, DDD, DM type 2, MI, HTN, cervical fusion, and Lumbar fusion (2019).    Clinical Impression   PTA, pt was living alone and was independent with use of SPC for functional mobility. Pt currently performing ADLs and functional mobility at Supervision level. Providing education and handout on back precautions, bed mobility, LB ADLs, and functional transfers. Pt demonstrated understanding. Pt planning for friends to check on him daily and to assist with IADLs. Recommend dc home once medically stable per physician. All acute OT needs met and education provided. Will sign off.     Follow Up Recommendations  No OT follow up    Equipment Recommendations  None recommended by OT    Recommendations for Other Services       Precautions / Restrictions Precautions Precautions: Back Precaution Booklet Issued: Yes (comment) Precaution Comments: Reviewed handout and back precautions. Required Braces or Orthoses: Other Brace Other Brace: No brace per MD order      Mobility Bed Mobility Overal bed mobility: Modified Independent             General bed mobility comments: Increased time and use of log roll  Transfers Overall transfer level: Needs assistance Equipment used: Straight cane Transfers: Sit to/from Stand Sit to Stand: Supervision         General transfer comment: Supervision for intitial safety    Balance Overall balance assessment: Mild deficits observed, not formally tested                                         ADL either performed or assessed with clinical judgement   ADL Overall ADL's : Needs assistance/impaired                                       General ADL Comments: Pt performing ADLs and  functional mobility at Supervision level. Providing education and handout on back precautions, LB dressing, bed mobility, and functional transfers.     Vision         Perception     Praxis      Pertinent Vitals/Pain Pain Assessment: 0-10 Pain Score: 3  Pain Location: Back Pain Descriptors / Indicators: Discomfort Pain Intervention(s): Monitored during session;Repositioned     Hand Dominance     Extremity/Trunk Assessment Upper Extremity Assessment Upper Extremity Assessment: Overall WFL for tasks assessed   Lower Extremity Assessment Lower Extremity Assessment: Overall WFL for tasks assessed(Reports slight numbness distally)   Cervical / Trunk Assessment Cervical / Trunk Assessment: Other exceptions Cervical / Trunk Exceptions: s/p back sx   Communication Communication Communication: No difficulties   Cognition Arousal/Alertness: Awake/alert Behavior During Therapy: WFL for tasks assessed/performed Overall Cognitive Status: Within Functional Limits for tasks assessed                                     General Comments  Bandage at sx site in place    Exercises     Shoulder Instructions      Home Living Family/patient expects to be discharged to::  Private residence Living Arrangements: Alone Available Help at Discharge: Friend(s);Available PRN/intermittently Type of Home: House Home Access: Stairs to enter Entrance Stairs-Number of Steps: 1   Home Layout: Two level;Laundry or work area in basement;Able to live on main level with bedroom/bathroom Alternate Therapist, sports of Steps: Recruitment consultant Shower/Tub: Occupational psychologist: Standard     Home Equipment: Civil engineer, contracting - built in;Cane - single point;Grab bars - tub/shower;Toilet riser   Additional Comments: Friends plan on check in each day and assisting with IADLs      Prior Functioning/Environment Level of Independence: Independent        Comments: Pt using  SPC recently for balance. Performing all ADLs and IADLs including driving        OT Problem List: Decreased activity tolerance;Decreased range of motion;Impaired balance (sitting and/or standing);Decreased knowledge of use of DME or AE;Decreased knowledge of precautions;Pain      OT Treatment/Interventions:      OT Goals(Current goals can be found in the care plan section) Acute Rehab OT Goals Patient Stated Goal: "Go home" OT Goal Formulation: All assessment and education complete, DC therapy  OT Frequency:     Barriers to D/C:            Co-evaluation              AM-PAC OT "6 Clicks" Daily Activity     Outcome Measure Help from another person eating meals?: None Help from another person taking care of personal grooming?: None Help from another person toileting, which includes using toliet, bedpan, or urinal?: None Help from another person bathing (including washing, rinsing, drying)?: None Help from another person to put on and taking off regular upper body clothing?: None Help from another person to put on and taking off regular lower body clothing?: None 6 Click Score: 24   End of Session Equipment Utilized During Treatment: Other (comment)(SPC) Nurse Communication: Mobility status;Precautions  Activity Tolerance: Patient tolerated treatment well Patient left: in chair;with call bell/phone within reach  OT Visit Diagnosis: Unsteadiness on feet (R26.81);Other abnormalities of gait and mobility (R26.89);Muscle weakness (generalized) (M62.81);Pain Pain - part of body: (Back)                Time: 3832-9191 OT Time Calculation (min): 19 min Charges:  OT General Charges $OT Visit: 1 Visit OT Evaluation $OT Eval Low Complexity: Rossie, OTR/L Acute Rehab Pager: [phone] Office: [place] 03/27/2019, 8:02 AM

## 2019-03-27 NOTE — Discharge Summary (Signed)
Discharge Summary  Date of Admission: 03/26/2019  Date of Discharge: 03/27/19  Attending Physician: Earnie Larsson, MD  Hospital Course: Patient was admitted following an uncomplicated lumbar laminectomy. He was recovered in PACU and transferred to [facility]. His hospital course was uncomplicated and the patient was discharged home on 03/27/2019. He will follow up in clinic with Dr. Annette Stable in 2 weeks.  Neurologic exam at discharge:  AOx3, PERRL, EOMI, FS, TM Strength 5/5 x4, SILTx4  Discharge diagnosis: lumbar stenosis  Judith Part, MD 03/27/19 8:28 AM

## 2019-03-27 NOTE — Discharge Instructions (Addendum)

## 2019-03-27 NOTE — Plan of Care (Signed)
Patient alert and oriented, Mae's well, voiding adequate amount of urine, swallowing without difficulty, c/o moderate pain and meds given prior to discharged for ride and discomfort. Patient discharged home with family. Script and discharged instructions given to patient. Patient and family stated understanding of instructions given. Patient has an appointment with Dr. Annette Stable

## 2019-03-27 NOTE — Evaluation (Signed)
Physical Therapy Evaluation & Discharge Patient Details Name: Bryan Mcguire MRN: [id number] DOB: October 10, 1950 Today's Date: 03/27/2019   History of Present Illness  Pt is a 68 y/o male s/p L2-5 decompression and fusion. PMH including anxiety, arthritis, CAD, DDD, DM type 2, MI, HTN, cervical fusion, and Lumbar fusion (2019).    Clinical Impression  Pt presented seated in recliner chair, awake and willing to participate in therapy session. Prior to admission, pt reported that he was independent with ADLs and has been recently using a cane to ambulate for stability. Pt lives alone in a two level home (laundry in basement) with one step to enter. He will have assistance from family/friends intermittently upon d/c. At the time of evaluation, pt at supervision level for all functional mobility including stair training. PT reviewed back precautions, car transfers and a generalized walking program for pt to initiate upon d/c home. No further acute PT needs identified at this time. PT signing off.      Follow Up Recommendations Home health PT    Equipment Recommendations  None recommended by PT    Recommendations for Other Services       Precautions / Restrictions Precautions Precautions: Back Precaution Booklet Issued: Yes (comment) Precaution Comments: pt able to recall 3/3 back precautions Required Braces or Orthoses: Other Brace Other Brace: No brace per MD order Restrictions Weight Bearing Restrictions: No      Mobility  Bed Mobility Overal bed mobility: Modified Independent             General bed mobility comments: pt seated in recliner chair upon arrival  Transfers Overall transfer level: Needs assistance Equipment used: Straight cane Transfers: Sit to/from Stand Sit to Stand: Supervision         General transfer comment: Supervision for intitial safety  Ambulation/Gait Ambulation/Gait assistance: Supervision Gait Distance (Feet): 400 Feet Assistive device:  Straight cane Gait Pattern/deviations: Step-through pattern;Decreased stride length Gait velocity: decreased   General Gait Details: pt with modest instability, but controlled with use of straight cane; no overt LOB or need for physical assistance  Stairs Stairs: Yes Stairs assistance: Supervision Stair Management: One rail Left;Step to pattern;Forwards Number of Stairs: 5 General stair comments: no instability or LOB; supervision for safety; use of railing and cane  Wheelchair Mobility    Modified Rankin (Stroke Patients Only)       Balance Overall balance assessment: Mild deficits observed, not formally tested                                           Pertinent Vitals/Pain Pain Assessment: 0-10 Pain Score: 3  Pain Location: Back Pain Descriptors / Indicators: Discomfort Pain Intervention(s): Monitored during session;Repositioned    Home Living Family/patient expects to be discharged to:: Private residence Living Arrangements: Alone Available Help at Discharge: Friend(s);Available PRN/intermittently Type of Home: House Home Access: Stairs to enter   Entrance Stairs-Number of Steps: 1 Home Layout: Two level;Laundry or work area in basement;Able to live on main level with bedroom/bathroom Home Equipment: Civil engineer, contracting - built in;Cane - single point;Grab bars - tub/shower;Toilet riser Additional Comments: Friends plan on check in each day and assisting with IADLs    Prior Function Level of Independence: Independent         Comments: Pt using SPC recently for balance. Performing all ADLs and IADLs including driving     Hand Dominance  Extremity/Trunk Assessment   Upper Extremity Assessment Upper Extremity Assessment: Overall WFL for tasks assessed    Lower Extremity Assessment Lower Extremity Assessment: Overall WFL for tasks assessed    Cervical / Trunk Assessment Cervical / Trunk Assessment: Other exceptions Cervical / Trunk  Exceptions: s/p back sx  Communication   Communication: No difficulties  Cognition Arousal/Alertness: Awake/alert Behavior During Therapy: WFL for tasks assessed/performed Overall Cognitive Status: Within Functional Limits for tasks assessed                                        General Comments General comments (skin integrity, edema, etc.): Bandage at sx site in place    Exercises     Assessment/Plan    PT Assessment Patent does not need any further PT services;All further PT needs can be met in the next venue of care  PT Problem List Decreased balance;Decreased mobility;Decreased coordination;Pain       PT Treatment Interventions      PT Goals (Current goals can be found in the Care Plan section)  Acute Rehab PT Goals Patient Stated Goal: "Go home" PT Goal Formulation: All assessment and education complete, DC therapy    Frequency     Barriers to discharge        Co-evaluation               AM-PAC PT "6 Clicks" Mobility  Outcome Measure Help needed turning from your back to your side while in a flat bed without using bedrails?: None Help needed moving from lying on your back to sitting on the side of a flat bed without using bedrails?: None Help needed moving to and from a bed to a chair (including a wheelchair)?: None Help needed standing up from a chair using your arms (e.g., wheelchair or bedside chair)?: None Help needed to walk in hospital room?: None Help needed climbing 3-5 steps with a railing? : None 6 Click Score: 24    End of Session   Activity Tolerance: Patient tolerated treatment well Patient left: in bed;with call bell/phone within reach Nurse Communication: Mobility status PT Visit Diagnosis: Other abnormalities of gait and mobility (R26.89);Pain Pain - part of body: (back)    Time: 0102-7253 PT Time Calculation (min) (ACUTE ONLY): 28 min   Charges:   PT Evaluation $PT Eval Low Complexity: 1 Low PT  Treatments $Self Care/Home Management: 8-22        Sherie Don, PT, DPT  Acute Rehabilitation Services Pager [phone] Office [place] 03/27/2019, 9:22 AM

## 2019-03-27 NOTE — TOC Transition Note (Signed)
Transition of Care [facility]) - CM/SW Discharge Note   Patient Details  Name: Bryan Mcguire MRN: [id number] Date of Birth: [birth year]  [facility] of [facility]) CM/SW Contact:  Claudie Leach, RN Phone Number: 03/27/2019, 11:48 AM   Clinical Narrative:    Pt to d/c home with family.  Patient prefers [facility].  Referral called to [facility] on call.  [facility] accepts patient in referral. Orders, notes faxed to [phone].   Final next level of care: [place] Barriers to Discharge: No Barriers Identified   Patient Goals and CMS Choice Patient states their goals for this hospitalization and ongoing recovery are:: to function well at home CMS Medicare.gov Compare Post Acute Care list provided to:: Patient Choice offered to / list presented to : Patient   Discharge Plan and Services    HH Arranged: PT, OT [facility] Agency: [place] Date [place]: 03/27/19 Time [place]: 1118 Representative spoke with at [place]: [facility]

## 2019-03-28 DIAGNOSIS — Z79899 Other long term (current) drug therapy: Secondary | ICD-10-CM | POA: Diagnosis not present

## 2019-03-28 DIAGNOSIS — M4310 Spondylolisthesis, site unspecified: Secondary | ICD-10-CM | POA: Diagnosis not present

## 2019-03-28 DIAGNOSIS — E119 Type 2 diabetes mellitus without complications: Secondary | ICD-10-CM | POA: Diagnosis not present

## 2019-03-28 DIAGNOSIS — E785 Hyperlipidemia, unspecified: Secondary | ICD-10-CM | POA: Diagnosis not present

## 2019-03-28 DIAGNOSIS — R2689 Other abnormalities of gait and mobility: Secondary | ICD-10-CM | POA: Diagnosis not present

## 2019-03-28 DIAGNOSIS — R002 Palpitations: Secondary | ICD-10-CM | POA: Diagnosis not present

## 2019-03-28 DIAGNOSIS — I25119 Atherosclerotic heart disease of native coronary artery with unspecified angina pectoris: Secondary | ICD-10-CM | POA: Diagnosis not present

## 2019-03-28 DIAGNOSIS — Z7982 Long term (current) use of aspirin: Secondary | ICD-10-CM | POA: Diagnosis not present

## 2019-03-28 DIAGNOSIS — F419 Anxiety disorder, unspecified: Secondary | ICD-10-CM | POA: Diagnosis not present

## 2019-03-28 DIAGNOSIS — M5416 Radiculopathy, lumbar region: Secondary | ICD-10-CM | POA: Diagnosis not present

## 2019-03-28 DIAGNOSIS — Z7984 Long term (current) use of oral hypoglycemic drugs: Secondary | ICD-10-CM | POA: Diagnosis not present

## 2019-03-28 DIAGNOSIS — M1990 Unspecified osteoarthritis, unspecified site: Secondary | ICD-10-CM | POA: Diagnosis not present

## 2019-03-28 DIAGNOSIS — Z4789 Encounter for other orthopedic aftercare: Secondary | ICD-10-CM | POA: Diagnosis not present

## 2019-03-28 DIAGNOSIS — I493 Ventricular premature depolarization: Secondary | ICD-10-CM | POA: Diagnosis not present

## 2019-03-28 DIAGNOSIS — I252 Old myocardial infarction: Secondary | ICD-10-CM | POA: Diagnosis not present

## 2019-03-28 DIAGNOSIS — Z981 Arthrodesis status: Secondary | ICD-10-CM | POA: Diagnosis not present

## 2019-03-28 DIAGNOSIS — Z87891 Personal history of nicotine dependence: Secondary | ICD-10-CM | POA: Diagnosis not present

## 2019-03-28 DIAGNOSIS — M48062 Spinal stenosis, lumbar region with neurogenic claudication: Secondary | ICD-10-CM | POA: Diagnosis not present

## 2019-03-28 DIAGNOSIS — I10 Essential (primary) hypertension: Secondary | ICD-10-CM | POA: Diagnosis not present

## 2019-03-29 ENCOUNTER — Encounter (HOSPITAL_COMMUNITY): Payer: Self-pay | Admitting: Neurosurgery

## 2019-03-29 LAB — GLUCOSE, CAPILLARY
Glucose-Capillary: 157 mg/dL — ABNORMAL HIGH (ref 70–99)
Glucose-Capillary: 179 mg/dL — ABNORMAL HIGH (ref 70–99)

## 2019-03-29 NOTE — Anesthesia Postprocedure Evaluation (Signed)
Anesthesia Post Note  Patient: ALEXES LAMARQUE  Procedure(s) Performed: Laminectomy and Foraminotomy - Lumbar two-Lumbar three (N/A Back) Removal Lumbar two pedicle screw (N/A Back)     Patient location during evaluation: PACU Anesthesia Type: General Level of consciousness: awake and alert Pain management: pain level controlled Vital Signs Assessment: post-procedure vital signs reviewed and stable Respiratory status: spontaneous breathing, nonlabored ventilation, respiratory function stable and patient connected to nasal cannula oxygen Cardiovascular status: blood pressure returned to baseline and stable Postop Assessment: no apparent nausea or vomiting Anesthetic complications: no    Last Vitals:  Vitals:   03/27/19 0305 03/27/19 0730  BP: 99/68 107/74  Pulse: 63 65  Resp:  18  Temp: 36.8 C 37 C  SpO2: 96% 98%    Last Pain:  Vitals:   03/27/19 0933  TempSrc:   PainSc: 3                  Sabrinna Yearwood

## 2019-04-12 ENCOUNTER — Other Ambulatory Visit: Payer: Self-pay

## 2019-04-12 ENCOUNTER — Inpatient Hospital Stay (HOSPITAL_COMMUNITY): Payer: PPO

## 2019-04-12 ENCOUNTER — Inpatient Hospital Stay (HOSPITAL_COMMUNITY)
Admission: EM | Admit: 2019-04-12 | Discharge: 2019-04-13 | DRG: 948 | Disposition: A | Discharge status: Home / Self Care | Payer: PPO | Attending: Neurosurgery | Admitting: Neurosurgery

## 2019-04-12 ENCOUNTER — Encounter (HOSPITAL_COMMUNITY): Payer: Self-pay

## 2019-04-12 DIAGNOSIS — Z888 Allergy status to other drugs, medicaments and biological substances status: Secondary | ICD-10-CM | POA: Diagnosis not present

## 2019-04-12 DIAGNOSIS — M6281 Muscle weakness (generalized): Secondary | ICD-10-CM | POA: Diagnosis not present

## 2019-04-12 DIAGNOSIS — K5903 Drug induced constipation: Secondary | ICD-10-CM | POA: Diagnosis not present

## 2019-04-12 DIAGNOSIS — I252 Old myocardial infarction: Secondary | ICD-10-CM

## 2019-04-12 DIAGNOSIS — R350 Frequency of micturition: Secondary | ICD-10-CM | POA: Diagnosis present

## 2019-04-12 DIAGNOSIS — Z79899 Other long term (current) drug therapy: Secondary | ICD-10-CM | POA: Diagnosis not present

## 2019-04-12 DIAGNOSIS — R531 Weakness: Secondary | ICD-10-CM | POA: Diagnosis not present

## 2019-04-12 DIAGNOSIS — Z79891 Long term (current) use of opiate analgesic: Secondary | ICD-10-CM | POA: Diagnosis not present

## 2019-04-12 DIAGNOSIS — R29898 Other symptoms and signs involving the musculoskeletal system: Secondary | ICD-10-CM

## 2019-04-12 DIAGNOSIS — Z7984 Long term (current) use of oral hypoglycemic drugs: Secondary | ICD-10-CM | POA: Diagnosis not present

## 2019-04-12 DIAGNOSIS — Z87891 Personal history of nicotine dependence: Secondary | ICD-10-CM

## 2019-04-12 DIAGNOSIS — M48062 Spinal stenosis, lumbar region with neurogenic claudication: Secondary | ICD-10-CM

## 2019-04-12 DIAGNOSIS — F419 Anxiety disorder, unspecified: Secondary | ICD-10-CM | POA: Diagnosis present

## 2019-04-12 DIAGNOSIS — G8918 Other acute postprocedural pain: Secondary | ICD-10-CM | POA: Diagnosis not present

## 2019-04-12 DIAGNOSIS — Z955 Presence of coronary angioplasty implant and graft: Secondary | ICD-10-CM | POA: Diagnosis not present

## 2019-04-12 DIAGNOSIS — Z20828 Contact with and (suspected) exposure to other viral communicable diseases: Secondary | ICD-10-CM | POA: Diagnosis present

## 2019-04-12 DIAGNOSIS — E119 Type 2 diabetes mellitus without complications: Secondary | ICD-10-CM | POA: Diagnosis present

## 2019-04-12 DIAGNOSIS — Z91018 Allergy to other foods: Secondary | ICD-10-CM | POA: Diagnosis not present

## 2019-04-12 DIAGNOSIS — Z981 Arthrodesis status: Secondary | ICD-10-CM

## 2019-04-12 DIAGNOSIS — W19XXXA Unspecified fall, initial encounter: Secondary | ICD-10-CM | POA: Diagnosis not present

## 2019-04-12 DIAGNOSIS — G4730 Sleep apnea, unspecified: Secondary | ICD-10-CM | POA: Diagnosis present

## 2019-04-12 DIAGNOSIS — I2510 Atherosclerotic heart disease of native coronary artery without angina pectoris: Secondary | ICD-10-CM | POA: Diagnosis present

## 2019-04-12 DIAGNOSIS — K219 Gastro-esophageal reflux disease without esophagitis: Secondary | ICD-10-CM | POA: Diagnosis present

## 2019-04-12 DIAGNOSIS — M79605 Pain in left leg: Secondary | ICD-10-CM | POA: Diagnosis not present

## 2019-04-12 DIAGNOSIS — I10 Essential (primary) hypertension: Secondary | ICD-10-CM | POA: Diagnosis not present

## 2019-04-12 LAB — SEDIMENTATION RATE: Sed Rate: 66 mm/hr — ABNORMAL HIGH (ref 0–16)

## 2019-04-12 LAB — GLUCOSE, CAPILLARY
Glucose-Capillary: 209 mg/dL — ABNORMAL HIGH (ref 70–99)
Glucose-Capillary: 138 mg/dL — ABNORMAL HIGH (ref 70–99)

## 2019-04-12 LAB — CBC WITH DIFFERENTIAL/PLATELET
Abs Immature Granulocytes: 0.04 10*3/uL (ref 0.00–0.07)
Basophils Absolute: 0 10*3/uL (ref 0.0–0.1)
Basophils Relative: 0 %
Eosinophils Absolute: 0 10*3/uL (ref 0.0–0.5)
Eosinophils Relative: 0 %
HCT: 36.5 % — ABNORMAL LOW (ref 39.0–52.0)
Hemoglobin: 12.2 g/dL — ABNORMAL LOW (ref 13.0–17.0)
Immature Granulocytes: 0 %
Lymphocytes Relative: 18 %
Lymphs Abs: 1.7 10*3/uL (ref 0.7–4.0)
MCH: 31 pg (ref 26.0–34.0)
MCHC: 33.4 g/dL (ref 30.0–36.0)
MCV: 92.6 fL (ref 80.0–100.0)
Monocytes Absolute: 1.2 10*3/uL — ABNORMAL HIGH (ref 0.1–1.0)
Monocytes Relative: 13 %
Neutro Abs: 6.6 10*3/uL (ref 1.7–7.7)
Neutrophils Relative %: 69 %
Platelets: 249 10*3/uL (ref 150–400)
RBC: 3.94 MIL/uL — ABNORMAL LOW (ref 4.22–5.81)
RDW: 11.9 % (ref 11.5–15.5)
WBC: 9.5 10*3/uL (ref 4.0–10.5)
nRBC: 0 % (ref 0.0–0.2)

## 2019-04-12 LAB — MRSA PCR SCREENING: MRSA by PCR: NEGATIVE

## 2019-04-12 LAB — C-REACTIVE PROTEIN: CRP: 10.6 mg/dL — ABNORMAL HIGH (ref <1.0)

## 2019-04-12 LAB — BASIC METABOLIC PANEL
Anion gap: 12 (ref 5–15)
BUN: 8 mg/dL (ref 8–23)
CO2: 25 mmol/L (ref 22–32)
Calcium: 9.4 mg/dL (ref 8.9–10.3)
Chloride: 99 mmol/L (ref 98–111)
Creatinine, Ser: 1.05 mg/dL (ref 0.61–1.24)
GFR calc Af Amer: >60 mL/min (ref >60)
GFR calc non Af Amer: >60 mL/min (ref >60)
Glucose, Bld: 172 mg/dL — ABNORMAL HIGH (ref 70–99)
Potassium: 4.4 mmol/L (ref 3.5–5.1)
Sodium: 136 mmol/L (ref 135–145)

## 2019-04-12 LAB — SARS CORONAVIRUS 2 BY RT PCR (HOSPITAL ORDER, PERFORMED IN ~~LOC~~ HOSPITAL LAB): SARS Coronavirus 2: NEGATIVE

## 2019-04-12 MED ORDER — VITAMIN D (ERGOCALCIFEROL) 1.25 MG (50000 UNIT) PO CAPS
50000.0000 [IU] | ORAL_CAPSULE | ORAL | Status: DC
  Administered 2019-04-13: 50000 [IU] via ORAL
  Filled 2019-04-12: qty 1

## 2019-04-12 MED ORDER — ACETAMINOPHEN 325 MG PO TABS: 650.0000 mg | ORAL_TABLET | ORAL | Status: DC | PRN

## 2019-04-12 MED ORDER — ENSURE ENLIVE PO LIQD
237.0000 mL | Freq: Two times a day (BID) | ORAL | Status: DC
  Administered 2019-04-12: 16:00:00 237 mL via ORAL

## 2019-04-12 MED ORDER — MENTHOL 3 MG MT LOZG
1.0000 | LOZENGE | OROMUCOSAL | Status: DC | PRN
  Filled 2019-04-12: qty 9

## 2019-04-12 MED ORDER — MORPHINE SULFATE (PF) 2 MG/ML IV SOLN: 2.0000 mg | INTRAVENOUS | Status: DC | PRN

## 2019-04-12 MED ORDER — SODIUM CHLORIDE 0.9% FLUSH
3.0000 mL | INTRAVENOUS | Status: DC | PRN
  Administered 2019-04-13: 3 mL via INTRAVENOUS
  Filled 2019-04-12: qty 3

## 2019-04-12 MED ORDER — PANTOPRAZOLE SODIUM 40 MG IV SOLR
40.0000 mg | Freq: Every day | INTRAVENOUS | Status: DC
  Administered 2019-04-12: 21:00:00 40 mg via INTRAVENOUS
  Filled 2019-04-12: qty 40

## 2019-04-12 MED ORDER — PHENOL 1.4 % MT LIQD
1.0000 | OROMUCOSAL | Status: DC | PRN
  Filled 2019-04-12: qty 177

## 2019-04-12 MED ORDER — ALPRAZOLAM 0.25 MG PO TABS: 0.2500 mg | ORAL_TABLET | Freq: Three times a day (TID) | ORAL | Status: DC | PRN

## 2019-04-12 MED ORDER — GADOBUTROL 1 MMOL/ML IV SOLN
10.0000 mL | Freq: Once | INTRAVENOUS | Status: AC | PRN
  Administered 2019-04-12: 14:00:00 10 mL via INTRAVENOUS

## 2019-04-12 MED ORDER — CYCLOBENZAPRINE HCL 10 MG PO TABS
10.0000 mg | ORAL_TABLET | Freq: Three times a day (TID) | ORAL | Status: DC | PRN
  Administered 2019-04-12 – 2019-04-13 (×2): 10 mg via ORAL
  Filled 2019-04-12 (×2): qty 1

## 2019-04-12 MED ORDER — ZOLPIDEM TARTRATE 5 MG PO TABS
5.0000 mg | ORAL_TABLET | Freq: Every day | ORAL | Status: DC
  Administered 2019-04-12: 21:00:00 5 mg via ORAL
  Filled 2019-04-12: qty 1

## 2019-04-12 MED ORDER — PRAVASTATIN SODIUM 10 MG PO TABS
10.0000 mg | ORAL_TABLET | Freq: Every day | ORAL | Status: DC
  Administered 2019-04-12 – 2019-04-13 (×2): 10 mg via ORAL
  Filled 2019-04-12 (×2): qty 1

## 2019-04-12 MED ORDER — ACETAMINOPHEN 650 MG RE SUPP: 650.0000 mg | RECTAL | Status: DC | PRN

## 2019-04-12 MED ORDER — INSULIN ASPART 100 UNIT/ML ~~LOC~~ SOLN
0.0000 [IU] | Freq: Three times a day (TID) | SUBCUTANEOUS | Status: DC
  Administered 2019-04-12: 5 [IU] via SUBCUTANEOUS
  Administered 2019-04-13 (×2): 2 [IU] via SUBCUTANEOUS
  Administered 2019-04-13: 3 [IU] via SUBCUTANEOUS

## 2019-04-12 MED ORDER — VITAMIN B-12 1000 MCG PO TABS
5000.0000 ug | ORAL_TABLET | Freq: Every day | ORAL | Status: DC
  Administered 2019-04-13: 08:00:00 5000 ug via ORAL
  Filled 2019-04-12: qty 5

## 2019-04-12 MED ORDER — ALUM & MAG HYDROXIDE-SIMETH 200-200-20 MG/5ML PO SUSP: 30.0000 mL | Freq: Four times a day (QID) | ORAL | Status: DC | PRN

## 2019-04-12 MED ORDER — HYDROMORPHONE HCL 1 MG/ML IJ SOLN
0.5000 mg | Freq: Once | INTRAMUSCULAR | Status: AC
  Administered 2019-04-12: 0.5 mg via INTRAVENOUS
  Filled 2019-04-12: qty 1

## 2019-04-12 MED ORDER — DOCUSATE SODIUM 100 MG PO CAPS
100.0000 mg | ORAL_CAPSULE | Freq: Two times a day (BID) | ORAL | Status: DC
  Administered 2019-04-12 – 2019-04-13 (×2): 100 mg via ORAL
  Filled 2019-04-12 (×2): qty 1

## 2019-04-12 MED ORDER — FLUTICASONE PROPIONATE 50 MCG/ACT NA SUSP
1.0000 | Freq: Every day | NASAL | Status: DC | PRN
  Filled 2019-04-12: qty 16

## 2019-04-12 MED ORDER — PREGABALIN 50 MG PO CAPS
75.0000 mg | ORAL_CAPSULE | Freq: Two times a day (BID) | ORAL | Status: DC
  Administered 2019-04-12 – 2019-04-13 (×2): 75 mg via ORAL
  Filled 2019-04-12 (×2): qty 1

## 2019-04-12 MED ORDER — ONDANSETRON HCL 4 MG PO TABS: 4.0000 mg | ORAL_TABLET | Freq: Four times a day (QID) | ORAL | Status: DC | PRN

## 2019-04-12 MED ORDER — HYDROCODONE-ACETAMINOPHEN 10-325 MG PO TABS
1.0000 | ORAL_TABLET | ORAL | Status: DC | PRN
  Administered 2019-04-12 – 2019-04-13 (×5): 2 via ORAL
  Filled 2019-04-12 (×5): qty 2

## 2019-04-12 MED ORDER — DIAZEPAM 5 MG PO TABS
7.5000 mg | ORAL_TABLET | Freq: Once | ORAL | Status: AC
  Administered 2019-04-12: 13:00:00 7.5 mg via ORAL
  Filled 2019-04-12: qty 2

## 2019-04-12 MED ORDER — SODIUM CHLORIDE 0.9% FLUSH
3.0000 mL | Freq: Two times a day (BID) | INTRAVENOUS | Status: DC
  Administered 2019-04-12 – 2019-04-13 (×2): 3 mL via INTRAVENOUS

## 2019-04-12 MED ORDER — SODIUM CHLORIDE 0.9 % IV SOLN: 250.0000 mL | INTRAVENOUS | Status: DC

## 2019-04-12 MED ORDER — ONDANSETRON HCL 4 MG/2ML IJ SOLN
4.0000 mg | Freq: Once | INTRAMUSCULAR | Status: AC
  Administered 2019-04-12: 12:00:00 4 mg via INTRAVENOUS
  Filled 2019-04-12: qty 2

## 2019-04-12 MED ORDER — ONDANSETRON HCL 4 MG/2ML IJ SOLN: 4.0000 mg | Freq: Four times a day (QID) | INTRAMUSCULAR | Status: DC | PRN
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
## 2019-04-12 NOTE — ED Notes (Signed)
Lunch tray ordered 

## 2019-04-12 NOTE — Social Work (Signed)
CSW acknowledging consult for SNF placement. Will follow for therapy recommendations.   Cherity Blickenstaff, MSW, LCSWA [facility] Clinical Social Work [phone]   

## 2019-04-12 NOTE — ED Notes (Signed)
Attempted report 
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
## 2019-04-12 NOTE — ED Triage Notes (Signed)
Pt endorses having back surgery 3 weeks ago, pt states that Saturday morning pt woke up with left leg numbness/weakness and when he tried to use left leg he had severe pain. VSS. Axox4.

## 2019-04-13 LAB — GLUCOSE, CAPILLARY
Glucose-Capillary: 134 mg/dL — ABNORMAL HIGH (ref 70–99)
Glucose-Capillary: 154 mg/dL — ABNORMAL HIGH (ref 70–99)
Glucose-Capillary: 149 mg/dL — ABNORMAL HIGH (ref 70–99)

## 2019-04-13 MED ORDER — DOCUSATE SODIUM 100 MG PO CAPS: 100.0000 mg | ORAL_CAPSULE | Freq: Two times a day (BID) | ORAL | 0 refills | Status: DC

## 2019-04-13 MED ORDER — HYDROCODONE-ACETAMINOPHEN 10-325 MG PO TABS: 1.0000 | ORAL_TABLET | ORAL | 0 refills | Status: DC | PRN

## 2019-04-13 NOTE — Evaluation (Signed)
Physical Therapy Evaluation Patient Details Name: Bryan Mcguire MRN: [id number] DOB: [birth year] Today's Date: 04/13/2019   History of Present Illness  Patient is a 68 y/o male who presents with LLE pain and numbness s/p L2-5 decompression and fusion 7/31. PMH includes anxiety, CAD, DDD, DM2, HTN, cervical fusion, lumbar fusion (2019).  Clinical Impression  Patient presents with increased pain and numbness LLE s/p surgery on 7/31. Pt reports since last surgery he has been Mod I with mobility, using SPC and caring for self. Reports 2 recent falls due to LLE giving out without warning. Pt lives alone. Reports his son is working on trying to get someone to help him a few hours/day. Today, pt tolerated transfers and gait training with Min guard assist and RW for safety. Encouraged use of RW instead of SPC and pt agrees. Pt declining HH services at this time. Able to recall 3/3 back precautions. Will follow acutely to maximize independence and mobility prior to return home.    Follow Up Recommendations No PT follow up;Supervision - Intermittent(declining HH services)    Equipment Recommendations  None recommended by PT    Recommendations for Other Services       Precautions / Restrictions Precautions Precautions: Back Precaution Booklet Issued: No Precaution Comments: pt able to recall 3/3 back precautions from prior surgery Other Brace: No brace per MD order Restrictions Weight Bearing Restrictions: No      Mobility  Bed Mobility Overal bed mobility: Modified Independent             General bed mobility comments: HOB elevated and use of rail; good demo of log roll technique  Transfers Overall transfer level: Needs assistance Equipment used: Rolling walker (2 wheeled) Transfers: Sit to/from Stand Sit to Stand: Min guard         General transfer comment: Min guard for safety. Stood from [employer].  Ambulation/Gait Ambulation/Gait assistance: Min guard Gait Distance (Feet): 25  Feet Assistive device: Rolling walker (2 wheeled) Gait Pattern/deviations: Step-through pattern;Decreased stride length Gait velocity: decreased   General Gait Details: Slow, mostly steady gait with use of RW; increased WB through BUEs. Reports left knee weakness and increased numbness with standing.  Stairs            Wheelchair Mobility    Modified Rankin (Stroke Patients Only)       Balance Overall balance assessment: Needs assistance Sitting-balance support: Feet supported;No upper extremity supported Sitting balance-Leahy Scale: Good Sitting balance - Comments: Able to bring LEs up on thighs to simulate donning socks   Standing balance support: During functional activity Standing balance-Leahy Scale: Poor Standing balance comment: Reliant on BUEs for support in standing at this time due to LLE weakness and numbness.                             Pertinent Vitals/Pain Pain Assessment: Faces Faces Pain Scale: Hurts little more Pain Location: LLE, back Pain Descriptors / Indicators: Discomfort;Aching Pain Intervention(s): Repositioned;Monitored during session;Premedicated before session    Home Living Family/patient expects to be discharged to:: Private residence Living Arrangements: Alone Available Help at Discharge: Friend(s);Available PRN/intermittently Type of Home: House Home Access: Stairs to enter   Entrance Stairs-Number of Steps: 1 Home Layout: Two level;Laundry or work area in basement;Able to live on main level with bedroom/bathroom Home Equipment: Civil engineer, contracting - built in;Cane - single point;Grab bars - tub/shower;Toilet riser;Walker - 2 wheels;Hand held shower head Additional Comments: Son trying  to find someone to stay with him for a few hours each day to assist with IADLs.    Prior Function Level of Independence: Independent with assistive device(s)         Comments: Uses SPC for ambulation since surgery. Reports 2 falls due to LLE  giving out. Gets help with laundry since it is downstairs     Hand Dominance        Extremity/Trunk Assessment   Upper Extremity Assessment Upper Extremity Assessment: Defer to OT evaluation    Lower Extremity Assessment Lower Extremity Assessment: LLE deficits/detail;RLE deficits/detail RLE Sensation: history of peripheral neuropathy LLE Deficits / Details: Grossly ~3+-4/5 throughout with slow movements. LLE Sensation: decreased light touch;history of peripheral neuropathy(decreased sensation medial calf and thigh)    Cervical / Trunk Assessment Cervical / Trunk Assessment: Other exceptions Cervical / Trunk Exceptions: s/p back sx  Communication   Communication: No difficulties  Cognition Arousal/Alertness: Awake/alert Behavior During Therapy: WFL for tasks assessed/performed Overall Cognitive Status: Within Functional Limits for tasks assessed                                        General Comments      Exercises     Assessment/Plan    PT Assessment Patient needs continued PT services  PT Problem List Decreased balance;Decreased mobility;Pain;Decreased strength;Decreased range of motion;Impaired sensation       PT Treatment Interventions DME instruction;Therapeutic activities;Gait training;Therapeutic exercise;Patient/family education;Balance training;Functional mobility training    PT Goals (Current goals can be found in the Care Plan section)  Acute Rehab PT Goals Patient Stated Goal: to get this pain better PT Goal Formulation: With patient Time For Goal Achievement: 04/27/19 Potential to Achieve Goals: Good    Frequency Min 3X/week   Barriers to discharge Decreased caregiver support lives alone    Co-evaluation               AM-PAC PT "6 Clicks" Mobility  Outcome Measure Help needed turning from your back to your side while in a flat bed without using bedrails?: None Help needed moving from lying on your back to sitting on  the side of a flat bed without using bedrails?: None Help needed moving to and from a bed to a chair (including a wheelchair)?: None Help needed standing up from a chair using your arms (e.g., wheelchair or bedside chair)?: A Little Help needed to walk in hospital room?: A Little Help needed climbing 3-5 steps with a railing? : A Little 6 Click Score: 21    End of Session Equipment Utilized During Treatment: Gait belt Activity Tolerance: Patient tolerated treatment well Patient left: in chair;with call bell/phone within reach Nurse Communication: Mobility status PT Visit Diagnosis: Other abnormalities of gait and mobility (R26.89);Pain Pain - Right/Left: Left Pain - part of body: Leg    Time: 1660-6301 PT Time Calculation (min) (ACUTE ONLY): 21 min   Charges:   PT Evaluation $PT Eval Moderate Complexity: 1 Mod          Wray Kearns, PT, DPT Acute Rehabilitation Services Pager [phone] Office [place] 04/13/2019, 9:57 AM

## 2019-04-13 NOTE — Evaluation (Signed)
Occupational Therapy Evaluation Patient Details Name: Bryan Mcguire MRN: [id number] DOB: 1951-04-05 Today's Date: 04/13/2019    History of Present Illness Patient is a 68 y/o male who presents with LLE pain and numbness s/p L2-5 decompression and fusion 7/31. PMH includes anxiety, CAD, DDD, DM2, HTN, cervical fusion, lumbar fusion (2019).   Clinical Impression   Pt moving well and using RW for mobility with min guard A for safety. Pt requires min A for LB ADLs/selfcare. PTA, pt lived at home alone and was independent with ADLs/selfcare, home mgt and was driving. Pt's son lives nearby and can assist intermittently prn. Pt with previous back surgery and has all necessary DME and A/E at home. Pt able to recall all back precautions. All education completed and no further acute OT is indicated at this time    Follow Up Recommendations  No OT follow up;Supervision - Intermittent    Equipment Recommendations  None recommended by OT    Recommendations for Other Services       Precautions / Restrictions Precautions Precautions: Back Precaution Comments: pt able to recall 3/3 back precautions from prior surgery Other Brace: No brace per MD order Restrictions Weight Bearing Restrictions: No      Mobility Bed Mobility Overal bed mobility: Modified Independent             General bed mobility comments: able to demo good log roll technique  Transfers Overall transfer level: Needs assistance Equipment used: Rolling walker (2 wheeled) Transfers: Sit to/from Stand Sit to Stand: Min guard         General transfer comment: Min guard for safety    Balance Overall balance assessment: Needs assistance Sitting-balance support: Feet supported;No upper extremity supported Sitting balance-Leahy Scale: Good Sitting balance - Comments: able to doff/don socks by bringing LEs to thighs   Standing balance support: During functional activity Standing balance-Leahy Scale: Poor                              ADL either performed or assessed with clinical judgement   ADL Overall ADL's : Needs assistance/impaired Eating/Feeding: Independent;Sitting   Grooming: Wash/dry hands;Wash/dry face;Supervision/safety;Standing   Upper Body Bathing: Set up   Lower Body Bathing: Minimal assistance   Upper Body Dressing : Set up   Lower Body Dressing: Minimal assistance   Toilet Transfer: Min guard;Ambulation;RW;Comfort height toilet   Toileting- Clothing Manipulation and Hygiene: Min guard;Sit to/from stand   Tub/ Shower Transfer: Min guard;Ambulation;Rolling walker;Shower seat;3 in 1   Functional mobility during ADLs: Min guard;Rolling walker       Vision Patient Visual Report: No change from baseline       Perception     Praxis      Pertinent Vitals/Pain Pain Assessment: 0-10 Pain Score: 4  Pain Location: LLE, back Pain Descriptors / Indicators: Discomfort;Aching;Sore Pain Intervention(s): Monitored during session;Premedicated before session;Repositioned     Hand Dominance Right   Extremity/Trunk Assessment Upper Extremity Assessment Upper Extremity Assessment: Overall WFL for tasks assessed   Lower Extremity Assessment Lower Extremity Assessment: Defer to PT evaluation   Cervical / Trunk Assessment Cervical / Trunk Exceptions: s/p back sx   Communication Communication Communication: No difficulties   Cognition Arousal/Alertness: Awake/alert Behavior During Therapy: WFL for tasks assessed/performed Overall Cognitive Status: Within Functional Limits for tasks assessed  General Comments       Exercises     Shoulder Instructions      Home Living Family/patient expects to be discharged to:: Private residence   Available Help at Discharge: Friend(s);Available PRN/intermittently Type of Home: House Home Access: Stairs to enter Entrance Stairs-Number of Steps: 1   Home Layout: Two  level;Laundry or work area in basement;Able to live on main level with bedroom/bathroom Alternate Therapist, sports of Steps: Recruitment consultant Shower/Tub: Occupational psychologist: Standard     Home Equipment: Civil engineer, contracting - built in;Cane - single point;Grab bars - tub/shower;Toilet riser;Walker - 2 wheels;Hand held shower head   Additional Comments: Son trying to find someone to stay with him for a few hours each day to assist with IADLs.      Prior Functioning/Environment Level of Independence: Independent with assistive device(s)        Comments: Uses SPC for ambulation since surgery. Reports 2 falls due to LLE giving out. Gets help with laundry since it is downstairs. Was Independent with ADLs/selcare        OT Problem List: Decreased activity tolerance;Impaired balance (sitting and/or standing);Decreased knowledge of use of DME or AE;Decreased knowledge of precautions;Pain      OT Treatment/Interventions:      OT Goals(Current goals can be found in the care plan section) Acute Rehab OT Goals Patient Stated Goal: go home today OT Goal Formulation: All assessment and education complete, DC therapy  OT Frequency:     Barriers to D/C:            Co-evaluation              AM-PAC OT "6 Clicks" Daily Activity     Outcome Measure Help from another person eating meals?: None Help from another person taking care of personal grooming?: A Little Help from another person toileting, which includes using toliet, bedpan, or urinal?: A Little Help from another person bathing (including washing, rinsing, drying)?: A Little Help from another person to put on and taking off regular upper body clothing?: A Little Help from another person to put on and taking off regular lower body clothing?: A Little 6 Click Score: 19   End of Session Equipment Utilized During Treatment: Rolling walker  Activity Tolerance: Patient tolerated treatment well Patient left: Other  (comment);with nursing/sitter in room(sitting EOB with NT to take vitals)  OT Visit Diagnosis: Unsteadiness on feet (R26.81);Other abnormalities of gait and mobility (R26.89);Muscle weakness (generalized) (M62.81);Pain;History of falling (Z91.81) Pain - part of body: (back)                Time: 1202-1227 OT Time Calculation (min): 25 min Charges:  OT General Charges $OT Visit: 1 Visit OT Evaluation $OT Eval Moderate Complexity: 1 Mod   Britt Bottom 04/13/2019, 2:42 PM

## 2019-04-13 NOTE — Progress Notes (Signed)
Discharge instructions given to patient with follow up appointments and medications. PIV removed. Patient assisted with getting dressed and now waiting on ride. Will call for transport once patient ride is here.

## 2019-04-13 NOTE — Progress Notes (Signed)
Providing Compassionate, Quality Care - Together   Subjective: Patient reports no issues overnight. Left lower extremity still slightly weaker than right. Patient comfortable with plan to discharge home later today.  Objective: Vital signs in last 24 hours: Temp:  [98 F (36.7 C)-99.4 F (37.4 C)] 98 F (36.7 C) (08/18 0816) Pulse Rate:  [72-78] 72 (08/18 0816) Resp:  [12-21] 20 (08/18 0816) BP: (107-134)/(62-72) 110/70 (08/18 0816) SpO2:  [93 %-100 %] 100 % (08/18 0816) Weight:  [76.2 kg] 76.2 kg (08/17 1601)  Intake/Output from previous day: 08/17 0701 - 08/18 0700 In: 840 [P.O.:840] Out: 750 [Urine:750] Intake/Output this shift: Total I/O In: 240 [P.O.:240] Out: 250 [Urine:250]  Alert and oriented x 4 PERRLA Speech fluent CN II-XII grossly intact MAE Strength 5/5 BUE, RLE, 4/4 LLE Incision clean, dry, and intact  Lab Results: Recent Labs    04/12/19 1017  WBC 9.5  HGB 12.2*  HCT 36.5*  PLT 249   BMET Recent Labs    04/12/19 1017  NA 136  K 4.4  CL 99  CO2 25  GLUCOSE 172*  BUN 8  CREATININE 1.05  CALCIUM 9.4    Studies/Results: Mr Lumbar Spine W Wo Contrast  Result Date: 04/12/2019 CLINICAL DATA:  Pt with hx of several previous back surgeries, recently had some hardware removed and more fixation placed in back for stenosis. Last surgery was 10 days ago with pedicle screw removal and decompression. Increased left leg pain and weakness EXAM: MRI LUMBAR SPINE WITHOUT AND WITH CONTRAST TECHNIQUE: Multiplanar and multiecho pulse sequences of the lumbar spine were obtained without and with intravenous contrast. CONTRAST:  10 mL Gadavist COMPARISON:  04/16/2010, 01/28/2019 FINDINGS: Segmentation:  Standard. Alignment:  Physiologic. Vertebrae:  No fracture, evidence of discitis, or bone lesion. Conus medullaris and cauda equina: Conus extends to the T12 level. Conus and cauda equina appear normal. Paraspinal and other soft tissues: No acute paraspinal  abnormality. Postsurgical changes in the posterior paraspinal soft tissues at L2-3. Small fluid collection in the posterior paraspinal postsurgical bed at the laminectomy site measuring 2.9 x 1.2 x 1.8 cm consistent with a postoperative seroma or liquified hematoma. Disc levels: Disc spaces: Posterior lumbar interbody fusion from L2 through L5 with interval removal of the left L2 pedicle screw. T12-L1: No significant disc bulge. No evidence of neural foraminal stenosis. No central canal stenosis. Moderate bilateral facet arthropathy. L1-L2: Minimal broad-based disc bulge. No evidence of neural foraminal stenosis. No central canal stenosis. L2-L3: Prior interbody fusion. Interval removal of the left L2 pedicle screw. Moderate spinal stenosis. Moderate bilateral facet arthropathy. Small amount of fluid in the left L2-3 facet joint. Mild right foraminal narrowing. No left foraminal narrowing. L3-L4: Interbody fusion. Mild bilateral facet arthropathy. Mild-moderate spinal stenosis. Bilateral lateral recess narrowing. No evidence of neural foraminal stenosis. L4-L5: Interbody fusion. Mild right foraminal narrowing. No left foraminal narrowing. No central canal stenosis. L5-S1: Right paracentral annular fissure. Mild bilateral facet arthropathy. Mild left subarticular recess narrowing. Mild bilateral foraminal narrowing. No central canal stenosis. IMPRESSION: 1. At L2-3 there are postsurgical changes in the posterior paraspinal soft tissues at L2-3. Small fluid collection in the posterior paraspinal postsurgical bed at the laminectomy site measuring 2.9 x 1.2 x 1.8 cm consistent with a postoperative seroma or liquified hematoma. Prior interbody fusion. Interval removal of the left L2 pedicle screw. Moderate spinal stenosis. Moderate bilateral facet arthropathy. Small amount of fluid in the left L2-3 facet joint. Mild right foraminal narrowing. No left foraminal narrowing. Electronically  Signed   By: Kathreen Devoid   On:  04/12/2019 14:45    Assessment/Plan: Patient underwent an uncomplicated K4-8 decompression and removal of left L2 pedicle screw on 03/26/2019. His postop course was uncomplicated and he was discharged home. He developed increasing pain of his left lower extremity with associated weakness that started Friday night. Sunday night, when he got up to use the restroom, his left leg "gave out." He called EMS and presented to the [facility] ED. He reports a recent history of serous wound drainage for which he was taking Keflex. The drainage stopped on Friday. MRI shows expected post operative changes. Fluid collection is likely a seroma. Patient is afebrile. No other signs of active infection based on labs and clinical presentation.   LOS: 1 day    -Patient is still to work with OT -D/C home later today   Viona Gilmore, [place], AGNP-C Nurse Practitioner  [facility] [place]. [address], [address], [place],  [address] P: [phone]    F: [phone]  04/13/2019, 10:24 AM

## 2019-04-13 NOTE — Discharge Summary (Signed)
Physician Discharge Summary  Patient ID: Bryan Mcguire MRN: [id number] DOB/AGE: [birth year] 68 y.o.  Admit date: 04/12/2019 Discharge date: 04/13/2019  Admission Diagnoses: Acute post-operative pain  Discharge Diagnoses:  Active Problems:   Acute post-operative pain   Discharged Condition: good  Hospital Course: Patient admitted on 04/12/2019 due to acute postoperative pain. He underwent an uncomplicated W0-9 decompression and removal of left L2 pedicle screw on 03/26/2019. MRI  demonstrated expected post operative changes. Fluid collection is likely a seroma. Patient is afebrile. No other signs of active infection based on labs and clinical presentation. Patient's pain is well controlled.  He has worked with physical and occupational therapies.  He is ready for discharge home.  Consults: rehabilitation medicine  Significant Diagnostic Studies: radiology: Mr Lumbar Spine W Wo Contrast  Result Date: 04/12/2019 CLINICAL DATA:  Pt with hx of several previous back surgeries, recently had some hardware removed and more fixation placed in back for stenosis. Last surgery was 10 days ago with pedicle screw removal and decompression. Increased left leg pain and weakness EXAM: MRI LUMBAR SPINE WITHOUT AND WITH CONTRAST TECHNIQUE: Multiplanar and multiecho pulse sequences of the lumbar spine were obtained without and with intravenous contrast. CONTRAST:  10 mL Gadavist COMPARISON:  04/16/2010, 01/28/2019 FINDINGS: Segmentation:  Standard. Alignment:  Physiologic. Vertebrae:  No fracture, evidence of discitis, or bone lesion. Conus medullaris and cauda equina: Conus extends to the T12 level. Conus and cauda equina appear normal. Paraspinal and other soft tissues: No acute paraspinal abnormality. Postsurgical changes in the posterior paraspinal soft tissues at L2-3. Small fluid collection in the posterior paraspinal postsurgical bed at the laminectomy site measuring 2.9 x 1.2 x 1.8 cm consistent with a postoperative  seroma or liquified hematoma. Disc levels: Disc spaces: Posterior lumbar interbody fusion from L2 through L5 with interval removal of the left L2 pedicle screw. T12-L1: No significant disc bulge. No evidence of neural foraminal stenosis. No central canal stenosis. Moderate bilateral facet arthropathy. L1-L2: Minimal broad-based disc bulge. No evidence of neural foraminal stenosis. No central canal stenosis. L2-L3: Prior interbody fusion. Interval removal of the left L2 pedicle screw. Moderate spinal stenosis. Moderate bilateral facet arthropathy. Small amount of fluid in the left L2-3 facet joint. Mild right foraminal narrowing. No left foraminal narrowing. L3-L4: Interbody fusion. Mild bilateral facet arthropathy. Mild-moderate spinal stenosis. Bilateral lateral recess narrowing. No evidence of neural foraminal stenosis. L4-L5: Interbody fusion. Mild right foraminal narrowing. No left foraminal narrowing. No central canal stenosis. L5-S1: Right paracentral annular fissure. Mild bilateral facet arthropathy. Mild left subarticular recess narrowing. Mild bilateral foraminal narrowing. No central canal stenosis. IMPRESSION: 1. At L2-3 there are postsurgical changes in the posterior paraspinal soft tissues at L2-3. Small fluid collection in the posterior paraspinal postsurgical bed at the laminectomy site measuring 2.9 x 1.2 x 1.8 cm consistent with a postoperative seroma or liquified hematoma. Prior interbody fusion. Interval removal of the left L2 pedicle screw. Moderate spinal stenosis. Moderate bilateral facet arthropathy. Small amount of fluid in the left L2-3 facet joint. Mild right foraminal narrowing. No left foraminal narrowing. Electronically Signed   By: Kathreen Devoid   On: 04/12/2019 14:45    Treatments: Pain management  Discharge Exam: Blood pressure 123/71, pulse 76, temperature 97.8 F (36.6 C), temperature source Oral, resp. rate 20, height 5\' 8"  (1.727 m), weight 76.2 kg, SpO2 99 %.   Alert and  oriented x 4 PERRLA Speech fluent CN II-XII grossly intact MAE Strength 5/5 BUE, RLE, 4/4 LLE Incision clean, dry,  and intact  Disposition: Discharge disposition: 01-Home or Self Care       Discharge Instructions    For home use only DME Walker rolling   Complete by: As directed    Patient needs a walker to treat with the following condition: Status post lumbar spine surgery for decompression of spinal cord   Incentive spirometry RT   Complete by: As directed      Allergies as of 04/13/2019      Reactions   Duloxetine Other (See Comments)   "jittery nervous feeling"      Medication List    STOP taking these medications   cephALEXin 500 MG capsule Commonly known as: KEFLEX     TAKE these medications   ALPRAZolam 0.25 MG tablet Commonly known as: XANAX Take 0.25 mg by mouth 3 (three) times daily as needed for anxiety.   aspirin EC 81 MG tablet Take 81 mg by mouth daily.   docusate sodium 100 MG capsule Commonly known as: COLACE Take 1 capsule (100 mg total) by mouth 2 (two) times daily.   fluticasone 50 MCG/ACT nasal spray Commonly known as: FLONASE Place 1-2 sprays into both nostrils daily as needed for allergies.   HYDROcodone-acetaminophen 10-325 MG tablet Commonly known as: NORCO Take 1 tablet by mouth every 4 (four) hours as needed (pain). What changed: how much to take   metFORMIN 1000 MG tablet Commonly known as: GLUCOPHAGE Take 1,000 mg by mouth 2 (two) times daily with a meal. Breakfast & supper   methocarbamol 500 MG tablet Commonly known as: ROBAXIN Take 500 mg by mouth 4 (four) times daily as needed for pain or spasms.   ondansetron 4 MG tablet Commonly known as: ZOFRAN Take 4 mg by mouth every 6 (six) hours as needed for nausea/vomiting.   pantoprazole 40 MG tablet Commonly known as: PROTONIX Take 40 mg by mouth daily.   pravastatin 10 MG tablet Commonly known as: PRAVACHOL Take 10 mg by mouth daily with supper.   pregabalin 75 MG  capsule Commonly known as: LYRICA Take 75 mg by mouth 2 (two) times daily.   Vitamin B-12 5000 MCG Tbdp Take 5,000 mcg by mouth daily.   Vitamin D (Ergocalciferol) 1.25 MG (50000 UT) Caps capsule Commonly known as: DRISDOL Take 50,000 Units by mouth every Tuesday.   zolpidem 10 MG tablet Commonly known as: AMBIEN Take 10 mg by mouth at bedtime.            Durable Medical Equipment  (From admission, onward)         Start     Ordered   04/13/19 0000  For home use only DME Walker rolling    Question:  Patient needs a walker to treat with the following condition  Answer:  Status post lumbar spine surgery for decompression of spinal cord   04/13/19 1614         Follow-up Information    Earnie Larsson, MD. Schedule an appointment as soon as possible for a visit in 2 week(s).   Specialty: Neurosurgery Contact information: [address] [place] [address] [phone]           Signed: Patricia Nettle 04/13/2019, 4:16 PM

## 2019-04-17 LAB — CULTURE, BLOOD (ROUTINE X 2)
Culture: NO GROWTH
Culture: NO GROWTH

## 2019-04-30 DIAGNOSIS — E1165 Type 2 diabetes mellitus with hyperglycemia: Secondary | ICD-10-CM | POA: Diagnosis not present

## 2019-04-30 DIAGNOSIS — F418 Other specified anxiety disorders: Secondary | ICD-10-CM | POA: Diagnosis not present

## 2019-04-30 DIAGNOSIS — G4700 Insomnia, unspecified: Secondary | ICD-10-CM | POA: Diagnosis not present

## 2019-04-30 DIAGNOSIS — E782 Mixed hyperlipidemia: Secondary | ICD-10-CM | POA: Diagnosis not present

## 2019-04-30 DIAGNOSIS — I10 Essential (primary) hypertension: Secondary | ICD-10-CM | POA: Diagnosis not present

## 2019-05-05 DIAGNOSIS — M48062 Spinal stenosis, lumbar region with neurogenic claudication: Secondary | ICD-10-CM | POA: Diagnosis not present

## 2019-05-05 DIAGNOSIS — M545 Low back pain: Secondary | ICD-10-CM | POA: Diagnosis not present

## 2019-05-05 DIAGNOSIS — M79605 Pain in left leg: Secondary | ICD-10-CM | POA: Diagnosis not present

## 2019-05-05 DIAGNOSIS — M2560 Stiffness of unspecified joint, not elsewhere classified: Secondary | ICD-10-CM | POA: Diagnosis not present

## 2019-05-05 DIAGNOSIS — R2689 Other abnormalities of gait and mobility: Secondary | ICD-10-CM | POA: Diagnosis not present

## 2019-05-27 ENCOUNTER — Telehealth: Payer: Self-pay | Admitting: Nurse Practitioner

## 2019-05-27 ENCOUNTER — Other Ambulatory Visit: Payer: Self-pay | Admitting: Neurosurgery

## 2019-05-27 DIAGNOSIS — M48062 Spinal stenosis, lumbar region with neurogenic claudication: Secondary | ICD-10-CM

## 2019-05-27 NOTE — Telephone Encounter (Signed)
Phone call to patient to verify medication list and allergies for myelogram procedure. Pt aware he will not need to hold any medications for this procedure. Pre and post procedure instructions reviewed with pt. 

## 2019-06-01 ENCOUNTER — Other Ambulatory Visit: Payer: Self-pay

## 2019-06-01 ENCOUNTER — Ambulatory Visit
Admission: RE | Admit: 2019-06-01 | Discharge: 2019-06-01 | Disposition: A | Discharge status: Home / Self Care | Payer: PPO | Source: Ambulatory Visit | Attending: Neurosurgery | Admitting: Neurosurgery

## 2019-06-01 VITALS — BP 110/57 | HR 73

## 2019-06-01 DIAGNOSIS — M48062 Spinal stenosis, lumbar region with neurogenic claudication: Secondary | ICD-10-CM

## 2019-06-01 DIAGNOSIS — M5126 Other intervertebral disc displacement, lumbar region: Secondary | ICD-10-CM | POA: Diagnosis not present

## 2019-06-01 DIAGNOSIS — M4316 Spondylolisthesis, lumbar region: Secondary | ICD-10-CM | POA: Diagnosis not present

## 2019-06-01 MED ORDER — ONDANSETRON HCL 4 MG/2ML IJ SOLN
4.0000 mg | Freq: Once | INTRAMUSCULAR | Status: AC
  Administered 2019-06-01: 4 mg via INTRAMUSCULAR

## 2019-06-01 MED ORDER — IOPAMIDOL (ISOVUE-M 200) INJECTION 41%
15.0000 mL | Freq: Once | INTRAMUSCULAR | Status: AC
  Administered 2019-06-01: 15 mL via INTRATHECAL

## 2019-06-01 MED ORDER — DIAZEPAM 5 MG PO TABS
5.0000 mg | ORAL_TABLET | Freq: Once | ORAL | Status: AC
  Administered 2019-06-01: 13:00:00 5 mg via ORAL

## 2019-06-01 MED ORDER — MEPERIDINE HCL 100 MG/ML IJ SOLN
75.0000 mg | Freq: Once | INTRAMUSCULAR | Status: AC
  Administered 2019-06-01: 13:00:00 75 mg via INTRAMUSCULAR

## 2019-06-01 NOTE — Discharge Instructions (Signed)

## 2019-06-08 DIAGNOSIS — I10 Essential (primary) hypertension: Secondary | ICD-10-CM | POA: Diagnosis not present

## 2019-06-08 DIAGNOSIS — M4714 Other spondylosis with myelopathy, thoracic region: Secondary | ICD-10-CM | POA: Diagnosis not present

## 2019-06-15 DIAGNOSIS — M4804 Spinal stenosis, thoracic region: Secondary | ICD-10-CM | POA: Diagnosis not present

## 2019-06-15 DIAGNOSIS — M4714 Other spondylosis with myelopathy, thoracic region: Secondary | ICD-10-CM | POA: Diagnosis not present

## 2019-06-15 DIAGNOSIS — M546 Pain in thoracic spine: Secondary | ICD-10-CM | POA: Diagnosis not present

## 2019-06-17 ENCOUNTER — Other Ambulatory Visit: Payer: Self-pay | Admitting: Neurosurgery

## 2019-06-17 DIAGNOSIS — M4714 Other spondylosis with myelopathy, thoracic region: Secondary | ICD-10-CM | POA: Diagnosis not present

## 2019-06-18 ENCOUNTER — Other Ambulatory Visit (HOSPITAL_COMMUNITY)
Admission: RE | Admit: 2019-06-18 | Discharge: 2019-06-18 | Disposition: A | Discharge status: Home / Self Care | Payer: PPO | Source: Ambulatory Visit | Attending: Neurosurgery | Admitting: Neurosurgery

## 2019-06-18 DIAGNOSIS — Z01812 Encounter for preprocedural laboratory examination: Secondary | ICD-10-CM | POA: Diagnosis not present

## 2019-06-18 DIAGNOSIS — Z20828 Contact with and (suspected) exposure to other viral communicable diseases: Secondary | ICD-10-CM | POA: Insufficient documentation

## 2019-06-19 ENCOUNTER — Other Ambulatory Visit (HOSPITAL_COMMUNITY): Payer: PPO

## 2019-06-21 ENCOUNTER — Encounter (HOSPITAL_COMMUNITY): Payer: Self-pay | Admitting: *Deleted

## 2019-06-21 ENCOUNTER — Other Ambulatory Visit: Payer: Self-pay

## 2019-06-21 LAB — NOVEL CORONAVIRUS, NAA (HOSP ORDER, SEND-OUT TO REF LAB; TAT 18-24 HRS): SARS-CoV-2, NAA: NOT DETECTED
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
## 2019-06-21 NOTE — Progress Notes (Addendum)
Mr Donnel tested negative for Covid 19 06/18/2019 and has been in [place] since that time. Mr Turetsky has a CPAP and wears pillows for deliver , he will bring the pillows with him. I called and asked Lorriane Shire to fax me the cardiology clearance. Mr. Rissmiller is a type II diabetic A1C 04/30/2019 was 7.5.Mr Friloux reports that CBGs run 130-140 fasting.  I instructed patient to not take Suan Halter today (usually takes in the afternoon). I instructed Mr Rosati to not take Metformin in am.I instructed patient to check CBG after awaking and every 2 hours until arrival  to the hospital.  I Instructed patient if CBG is less than 70 to drink  1/2 cup of a clear juice. Recheck CBG in 15 minutes then call pre- op desk at [phone] for further instructions.

## 2019-06-21 NOTE — Anesthesia Preprocedure Evaluation (Addendum)
Anesthesia Evaluation  Patient identified by MRN, date of birth, ID band Patient awake    Reviewed: Allergy & Precautions, NPO status , Patient's Chart, lab work & pertinent test results  Airway Mallampati: II  TM Distance: >3 FB Neck ROM: Full    Dental  (+) Upper Dentures   Pulmonary sleep apnea and Continuous Positive Airway Pressure Ventilation , former smoker,    Pulmonary exam normal breath sounds clear to auscultation       Cardiovascular hypertension, + CAD, + Past MI (2011) and + Cardiac Stents (x 3 in 2011)  Normal cardiovascular exam Rhythm:Regular Rate:Normal  ECG: NSR   Neuro/Psych Anxiety Neuropathy    GI/Hepatic Neg liver ROS, GERD  Medicated and Controlled,  Endo/Other  diabetes, Oral Hypoglycemic Agents  Renal/GU negative Renal ROS     Musculoskeletal  (+) Arthritis , Ambulates with cane and walker   Abdominal   Peds  Hematology HLD   Anesthesia Other Findings Thoracic myelopathy  Reproductive/Obstetrics                          Anesthesia Physical Anesthesia Plan  ASA: III  Anesthesia Plan: General   Post-op Pain Management:    Induction: Intravenous  PONV Risk Score and Plan: 3 and Midazolam, Dexamethasone, Ondansetron and Treatment may vary due to age or medical condition  Airway Management Planned: Oral ETT  Additional Equipment:   Intra-op Plan:   Post-operative Plan: Extubation in OR  Informed Consent: I have reviewed the patients History and Physical, chart, labs and discussed the procedure including the risks, benefits and alternatives for the proposed anesthesia with the patient or authorized representative who has indicated his/her understanding and acceptance.     Dental advisory given  Plan Discussed with: CRNA  Anesthesia Plan Comments: (Reviewed PAT note written 06/21/2019 by Myra Gianotti, PA-C. SAME DAY WORK-UP   )      Anesthesia  Quick Evaluation

## 2019-06-22 ENCOUNTER — Encounter (HOSPITAL_COMMUNITY): Payer: Self-pay

## 2019-06-22 ENCOUNTER — Encounter (HOSPITAL_COMMUNITY)
Admission: RE | Disposition: A | Discharge status: Home / Self Care | Payer: Self-pay | Source: Home / Self Care | Attending: Neurosurgery

## 2019-06-22 ENCOUNTER — Ambulatory Visit (HOSPITAL_COMMUNITY): Payer: PPO | Admitting: Vascular Surgery

## 2019-06-22 ENCOUNTER — Other Ambulatory Visit: Payer: Self-pay

## 2019-06-22 ENCOUNTER — Ambulatory Visit (HOSPITAL_COMMUNITY): Payer: PPO

## 2019-06-22 ENCOUNTER — Observation Stay (HOSPITAL_COMMUNITY)
Admission: RE | Admit: 2019-06-22 | Discharge: 2019-06-23 | Disposition: A | Discharge status: Home / Self Care | Payer: PPO | Attending: Neurosurgery | Admitting: Neurosurgery

## 2019-06-22 DIAGNOSIS — M4714 Other spondylosis with myelopathy, thoracic region: Principal | ICD-10-CM | POA: Insufficient documentation

## 2019-06-22 DIAGNOSIS — G992 Myelopathy in diseases classified elsewhere: Secondary | ICD-10-CM | POA: Diagnosis not present

## 2019-06-22 DIAGNOSIS — Z7984 Long term (current) use of oral hypoglycemic drugs: Secondary | ICD-10-CM | POA: Diagnosis not present

## 2019-06-22 DIAGNOSIS — M4804 Spinal stenosis, thoracic region: Secondary | ICD-10-CM | POA: Diagnosis not present

## 2019-06-22 DIAGNOSIS — I10 Essential (primary) hypertension: Secondary | ICD-10-CM | POA: Diagnosis not present

## 2019-06-22 DIAGNOSIS — E785 Hyperlipidemia, unspecified: Secondary | ICD-10-CM | POA: Diagnosis not present

## 2019-06-22 DIAGNOSIS — Z79899 Other long term (current) drug therapy: Secondary | ICD-10-CM | POA: Diagnosis not present

## 2019-06-22 DIAGNOSIS — I252 Old myocardial infarction: Secondary | ICD-10-CM | POA: Diagnosis not present

## 2019-06-22 DIAGNOSIS — K219 Gastro-esophageal reflux disease without esophagitis: Secondary | ICD-10-CM | POA: Insufficient documentation

## 2019-06-22 DIAGNOSIS — I2510 Atherosclerotic heart disease of native coronary artery without angina pectoris: Secondary | ICD-10-CM | POA: Diagnosis not present

## 2019-06-22 DIAGNOSIS — G4730 Sleep apnea, unspecified: Secondary | ICD-10-CM | POA: Diagnosis not present

## 2019-06-22 DIAGNOSIS — J302 Other seasonal allergic rhinitis: Secondary | ICD-10-CM | POA: Insufficient documentation

## 2019-06-22 DIAGNOSIS — F419 Anxiety disorder, unspecified: Secondary | ICD-10-CM | POA: Diagnosis not present

## 2019-06-22 DIAGNOSIS — I25119 Atherosclerotic heart disease of native coronary artery with unspecified angina pectoris: Secondary | ICD-10-CM | POA: Diagnosis not present

## 2019-06-22 DIAGNOSIS — Z7982 Long term (current) use of aspirin: Secondary | ICD-10-CM | POA: Insufficient documentation

## 2019-06-22 DIAGNOSIS — Z87891 Personal history of nicotine dependence: Secondary | ICD-10-CM | POA: Insufficient documentation

## 2019-06-22 DIAGNOSIS — Z419 Encounter for procedure for purposes other than remedying health state, unspecified: Secondary | ICD-10-CM

## 2019-06-22 DIAGNOSIS — R2681 Unsteadiness on feet: Secondary | ICD-10-CM | POA: Insufficient documentation

## 2019-06-22 DIAGNOSIS — M1990 Unspecified osteoarthritis, unspecified site: Secondary | ICD-10-CM | POA: Insufficient documentation

## 2019-06-22 DIAGNOSIS — E1140 Type 2 diabetes mellitus with diabetic neuropathy, unspecified: Secondary | ICD-10-CM | POA: Insufficient documentation

## 2019-06-22 DIAGNOSIS — Z955 Presence of coronary angioplasty implant and graft: Secondary | ICD-10-CM | POA: Insufficient documentation

## 2019-06-22 DIAGNOSIS — G959 Disease of spinal cord, unspecified: Secondary | ICD-10-CM | POA: Diagnosis not present

## 2019-06-22 DIAGNOSIS — Z981 Arthrodesis status: Secondary | ICD-10-CM | POA: Diagnosis not present

## 2019-06-22 HISTORY — PX: LUMBAR LAMINECTOMY/DECOMPRESSION MICRODISCECTOMY: SHX5026

## 2019-06-22 HISTORY — DX: Hyperlipidemia, unspecified: E78.5

## 2019-06-22 LAB — GLUCOSE, CAPILLARY
Glucose-Capillary: 117 mg/dL — ABNORMAL HIGH (ref 70–99)
Glucose-Capillary: 150 mg/dL — ABNORMAL HIGH (ref 70–99)
Glucose-Capillary: 184 mg/dL — ABNORMAL HIGH (ref 70–99)
Glucose-Capillary: 181 mg/dL — ABNORMAL HIGH (ref 70–99)

## 2019-06-22 LAB — BASIC METABOLIC PANEL
Anion gap: 11 (ref 5–15)
BUN: 12 mg/dL (ref 8–23)
CO2: 27 mmol/L (ref 22–32)
Calcium: 9.8 mg/dL (ref 8.9–10.3)
Chloride: 100 mmol/L (ref 98–111)
Creatinine, Ser: 0.92 mg/dL (ref 0.61–1.24)
GFR calc Af Amer: >60 mL/min (ref >60)
GFR calc non Af Amer: >60 mL/min (ref >60)
Glucose, Bld: 128 mg/dL — ABNORMAL HIGH (ref 70–99)
Potassium: 4.1 mmol/L (ref 3.5–5.1)
Sodium: 138 mmol/L (ref 135–145)

## 2019-06-22 LAB — CBC WITH DIFFERENTIAL/PLATELET
Abs Immature Granulocytes: 0.01 10*3/uL (ref 0.00–0.07)
Basophils Absolute: 0 10*3/uL (ref 0.0–0.1)
Basophils Relative: 1 %
Eosinophils Absolute: 0.1 10*3/uL (ref 0.0–0.5)
Eosinophils Relative: 1 %
HCT: 40.2 % (ref 39.0–52.0)
Hemoglobin: 13.5 g/dL (ref 13.0–17.0)
Immature Granulocytes: 0 %
Lymphocytes Relative: 28 %
Lymphs Abs: 1.8 10*3/uL (ref 0.7–4.0)
MCH: 30.8 pg (ref 26.0–34.0)
MCHC: 33.6 g/dL (ref 30.0–36.0)
MCV: 91.8 fL (ref 80.0–100.0)
Monocytes Absolute: 0.6 10*3/uL (ref 0.1–1.0)
Monocytes Relative: 9 %
Neutro Abs: 3.9 10*3/uL (ref 1.7–7.7)
Neutrophils Relative %: 61 %
Platelets: 251 10*3/uL (ref 150–400)
RBC: 4.38 MIL/uL (ref 4.22–5.81)
RDW: 12.8 % (ref 11.5–15.5)
WBC: 6.5 10*3/uL (ref 4.0–10.5)
nRBC: 0 % (ref 0.0–0.2)

## 2019-06-22 SURGERY — LUMBAR LAMINECTOMY/DECOMPRESSION MICRODISCECTOMY 1 LEVEL
Anesthesia: General | Site: Spine Thoracic

## 2019-06-22 MED ORDER — FENTANYL CITRATE (PF) 100 MCG/2ML IJ SOLN
INTRAMUSCULAR | Status: AC
  Filled 2019-06-22: qty 2

## 2019-06-22 MED ORDER — ONDANSETRON HCL 4 MG PO TABS: 4.0000 mg | ORAL_TABLET | Freq: Four times a day (QID) | ORAL | Status: DC | PRN

## 2019-06-22 MED ORDER — FENTANYL CITRATE (PF) 250 MCG/5ML IJ SOLN
INTRAMUSCULAR | Status: DC | PRN
  Administered 2019-06-22 (×5): 50 ug via INTRAVENOUS

## 2019-06-22 MED ORDER — MENTHOL 3 MG MT LOZG: 1.0000 | LOZENGE | OROMUCOSAL | Status: DC | PRN

## 2019-06-22 MED ORDER — SODIUM CHLORIDE 0.9% FLUSH
3.0000 mL | Freq: Two times a day (BID) | INTRAVENOUS | Status: DC
  Administered 2019-06-22 (×2): 3 mL via INTRAVENOUS

## 2019-06-22 MED ORDER — HYDROCODONE-ACETAMINOPHEN 5-325 MG PO TABS
ORAL_TABLET | ORAL | Status: AC
  Filled 2019-06-22: qty 1

## 2019-06-22 MED ORDER — DEXAMETHASONE SODIUM PHOSPHATE 10 MG/ML IJ SOLN
10.0000 mg | Freq: Once | INTRAMUSCULAR | Status: AC
  Administered 2019-06-22: 10 mg via INTRAVENOUS
  Filled 2019-06-22: qty 1

## 2019-06-22 MED ORDER — BUPIVACAINE HCL (PF) 0.25 % IJ SOLN
INTRAMUSCULAR | Status: AC
  Filled 2019-06-22: qty 30

## 2019-06-22 MED ORDER — ALPRAZOLAM 0.25 MG PO TABS: 0.2500 mg | ORAL_TABLET | Freq: Three times a day (TID) | ORAL | Status: DC | PRN

## 2019-06-22 MED ORDER — CYCLOBENZAPRINE HCL 10 MG PO TABS: 10.0000 mg | ORAL_TABLET | Freq: Three times a day (TID) | ORAL | Status: DC | PRN

## 2019-06-22 MED ORDER — METHOCARBAMOL 500 MG PO TABS
ORAL_TABLET | ORAL | Status: AC
  Filled 2019-06-22: qty 1

## 2019-06-22 MED ORDER — FENTANYL CITRATE (PF) 250 MCG/5ML IJ SOLN
INTRAMUSCULAR | Status: AC
  Filled 2019-06-22: qty 5

## 2019-06-22 MED ORDER — ACETAMINOPHEN 500 MG PO TABS
1000.0000 mg | ORAL_TABLET | Freq: Once | ORAL | Status: AC
  Administered 2019-06-22: 500 mg via ORAL
  Filled 2019-06-22: qty 2

## 2019-06-22 MED ORDER — LIDOCAINE 2% (20 MG/ML) 5 ML SYRINGE
INTRAMUSCULAR | Status: DC | PRN
  Administered 2019-06-22: 60 mg via INTRAVENOUS

## 2019-06-22 MED ORDER — CHLORHEXIDINE GLUCONATE CLOTH 2 % EX PADS: 6.0000 | MEDICATED_PAD | Freq: Once | CUTANEOUS | Status: DC

## 2019-06-22 MED ORDER — METFORMIN HCL 500 MG PO TABS
1000.0000 mg | ORAL_TABLET | Freq: Two times a day (BID) | ORAL | Status: DC
  Administered 2019-06-22 – 2019-06-23 (×2): 1000 mg via ORAL
  Filled 2019-06-22 (×2): qty 2

## 2019-06-22 MED ORDER — INSULIN ASPART 100 UNIT/ML ~~LOC~~ SOLN
0.0000 [IU] | Freq: Three times a day (TID) | SUBCUTANEOUS | Status: DC
  Administered 2019-06-22: 3 [IU] via SUBCUTANEOUS
  Administered 2019-06-23: 2 [IU] via SUBCUTANEOUS

## 2019-06-22 MED ORDER — FENTANYL CITRATE (PF) 100 MCG/2ML IJ SOLN
25.0000 ug | INTRAMUSCULAR | Status: DC | PRN
  Administered 2019-06-22 (×2): 50 ug via INTRAVENOUS

## 2019-06-22 MED ORDER — 0.9 % SODIUM CHLORIDE (POUR BTL) OPTIME
TOPICAL | Status: DC | PRN
  Administered 2019-06-22: 1000 mL

## 2019-06-22 MED ORDER — ZOLPIDEM TARTRATE 5 MG PO TABS
5.0000 mg | ORAL_TABLET | Freq: Every day | ORAL | Status: DC
  Administered 2019-06-22: 5 mg via ORAL
  Filled 2019-06-22: qty 1

## 2019-06-22 MED ORDER — MIDAZOLAM HCL 5 MG/5ML IJ SOLN
INTRAMUSCULAR | Status: DC | PRN
  Administered 2019-06-22: 2 mg via INTRAVENOUS

## 2019-06-22 MED ORDER — ONDANSETRON HCL 4 MG/2ML IJ SOLN: 4.0000 mg | Freq: Once | INTRAMUSCULAR | Status: DC | PRN

## 2019-06-22 MED ORDER — FLUTICASONE PROPIONATE 50 MCG/ACT NA SUSP
1.0000 | Freq: Every day | NASAL | Status: DC | PRN
  Filled 2019-06-22: qty 16

## 2019-06-22 MED ORDER — HYDROMORPHONE HCL 1 MG/ML IJ SOLN: 1.0000 mg | INTRAMUSCULAR | Status: DC | PRN

## 2019-06-22 MED ORDER — DOCUSATE SODIUM 100 MG PO CAPS: 100.0000 mg | ORAL_CAPSULE | Freq: Two times a day (BID) | ORAL | Status: DC | PRN

## 2019-06-22 MED ORDER — PROPOFOL 10 MG/ML IV BOLUS
INTRAVENOUS | Status: AC
  Filled 2019-06-22: qty 20

## 2019-06-22 MED ORDER — ACETAMINOPHEN 650 MG RE SUPP: 650.0000 mg | RECTAL | Status: DC | PRN

## 2019-06-22 MED ORDER — THROMBIN 5000 UNITS EX SOLR
CUTANEOUS | Status: DC | PRN
  Administered 2019-06-22 (×2): 5000 [IU] via TOPICAL

## 2019-06-22 MED ORDER — HEMOSTATIC AGENTS (NO CHARGE) OPTIME
TOPICAL | Status: DC | PRN
  Administered 2019-06-22: 1 via TOPICAL

## 2019-06-22 MED ORDER — HYDROCODONE-ACETAMINOPHEN 5-325 MG PO TABS
1.0000 | ORAL_TABLET | ORAL | Status: DC | PRN
  Administered 2019-06-22: 1 via ORAL

## 2019-06-22 MED ORDER — ACETAMINOPHEN 325 MG PO TABS: 650.0000 mg | ORAL_TABLET | ORAL | Status: DC | PRN

## 2019-06-22 MED ORDER — PANTOPRAZOLE SODIUM 40 MG PO TBEC
40.0000 mg | DELAYED_RELEASE_TABLET | Freq: Every day | ORAL | Status: DC
  Administered 2019-06-23: 40 mg via ORAL
  Filled 2019-06-22: qty 1

## 2019-06-22 MED ORDER — SUGAMMADEX SODIUM 200 MG/2ML IV SOLN
INTRAVENOUS | Status: DC | PRN
  Administered 2019-06-22: 200 mg via INTRAVENOUS

## 2019-06-22 MED ORDER — LACTATED RINGERS IV SOLN
INTRAVENOUS | Status: DC
  Administered 2019-06-22 (×2): via INTRAVENOUS

## 2019-06-22 MED ORDER — SODIUM CHLORIDE 0.9% FLUSH: 3.0000 mL | INTRAVENOUS | Status: DC | PRN

## 2019-06-22 MED ORDER — ONDANSETRON HCL 4 MG/2ML IJ SOLN
INTRAMUSCULAR | Status: DC | PRN
  Administered 2019-06-22: 4 mg via INTRAVENOUS

## 2019-06-22 MED ORDER — CANAGLIFLOZIN 100 MG PO TABS
100.0000 mg | ORAL_TABLET | Freq: Every day | ORAL | Status: DC
  Administered 2019-06-23: 100 mg via ORAL
  Filled 2019-06-22: qty 1

## 2019-06-22 MED ORDER — MIDAZOLAM HCL 2 MG/2ML IJ SOLN
INTRAMUSCULAR | Status: AC
  Filled 2019-06-22: qty 2

## 2019-06-22 MED ORDER — PROPOFOL 10 MG/ML IV BOLUS
INTRAVENOUS | Status: DC | PRN
  Administered 2019-06-22: 60 mg via INTRAVENOUS
  Administered 2019-06-22: 140 mg via INTRAVENOUS

## 2019-06-22 MED ORDER — PHENYLEPHRINE 40 MCG/ML (10ML) SYRINGE FOR IV PUSH (FOR BLOOD PRESSURE SUPPORT)
PREFILLED_SYRINGE | INTRAVENOUS | Status: DC | PRN
  Administered 2019-06-22 (×2): 80 ug via INTRAVENOUS

## 2019-06-22 MED ORDER — VITAMIN B-12 1000 MCG PO TABS
5000.0000 ug | ORAL_TABLET | Freq: Every day | ORAL | Status: DC
  Filled 2019-06-22 (×2): qty 5

## 2019-06-22 MED ORDER — ASPIRIN EC 81 MG PO TBEC
81.0000 mg | DELAYED_RELEASE_TABLET | Freq: Every day | ORAL | Status: DC
  Administered 2019-06-22 – 2019-06-23 (×2): 81 mg via ORAL
  Filled 2019-06-22 (×2): qty 1

## 2019-06-22 MED ORDER — KETOROLAC TROMETHAMINE 15 MG/ML IJ SOLN
30.0000 mg | Freq: Four times a day (QID) | INTRAMUSCULAR | Status: AC
  Administered 2019-06-22 – 2019-06-23 (×3): 30 mg via INTRAVENOUS
  Filled 2019-06-22 (×3): qty 2

## 2019-06-22 MED ORDER — SODIUM CHLORIDE 0.9 % IV SOLN
INTRAVENOUS | Status: DC | PRN
  Administered 2019-06-22: 09:00:00

## 2019-06-22 MED ORDER — ONDANSETRON HCL 4 MG/2ML IJ SOLN: 4.0000 mg | Freq: Four times a day (QID) | INTRAMUSCULAR | Status: DC | PRN

## 2019-06-22 MED ORDER — SODIUM CHLORIDE 0.9 % IV SOLN
250.0000 mL | INTRAVENOUS | Status: DC
  Administered 2019-06-22: 250 mL via INTRAVENOUS

## 2019-06-22 MED ORDER — CEFAZOLIN SODIUM-DEXTROSE 1-4 GM/50ML-% IV SOLN
1.0000 g | Freq: Three times a day (TID) | INTRAVENOUS | Status: AC
  Administered 2019-06-22 (×2): 1 g via INTRAVENOUS
  Filled 2019-06-22 (×2): qty 50

## 2019-06-22 MED ORDER — THROMBIN 5000 UNITS EX SOLR
CUTANEOUS | Status: AC
  Filled 2019-06-22: qty 10000

## 2019-06-22 MED ORDER — SODIUM CHLORIDE 0.9 % IV SOLN
INTRAVENOUS | Status: DC | PRN
  Administered 2019-06-22: 25 ug/min via INTRAVENOUS

## 2019-06-22 MED ORDER — ROCURONIUM BROMIDE 10 MG/ML (PF) SYRINGE
PREFILLED_SYRINGE | INTRAVENOUS | Status: DC | PRN
  Administered 2019-06-22: 10 mg via INTRAVENOUS
  Administered 2019-06-22: 50 mg via INTRAVENOUS
  Administered 2019-06-22: 10 mg via INTRAVENOUS

## 2019-06-22 MED ORDER — PRAVASTATIN SODIUM 10 MG PO TABS
10.0000 mg | ORAL_TABLET | Freq: Every day | ORAL | Status: DC
  Administered 2019-06-22: 10 mg via ORAL
  Filled 2019-06-22: qty 1

## 2019-06-22 MED ORDER — HYDROCODONE-ACETAMINOPHEN 10-325 MG PO TABS
2.0000 | ORAL_TABLET | ORAL | Status: DC | PRN
  Administered 2019-06-22 – 2019-06-23 (×5): 2 via ORAL
  Filled 2019-06-22 (×5): qty 2

## 2019-06-22 MED ORDER — KETOROLAC TROMETHAMINE 30 MG/ML IJ SOLN
INTRAMUSCULAR | Status: DC | PRN
  Administered 2019-06-22: 30 mg via INTRAVENOUS

## 2019-06-22 MED ORDER — PREGABALIN 75 MG PO CAPS
75.0000 mg | ORAL_CAPSULE | Freq: Three times a day (TID) | ORAL | Status: DC
  Administered 2019-06-22 – 2019-06-23 (×3): 75 mg via ORAL
  Filled 2019-06-22 (×3): qty 1

## 2019-06-22 MED ORDER — VITAMIN D (ERGOCALCIFEROL) 1.25 MG (50000 UNIT) PO CAPS
50000.0000 [IU] | ORAL_CAPSULE | ORAL | Status: DC
  Filled 2019-06-22: qty 1

## 2019-06-22 MED ORDER — PHENOL 1.4 % MT LIQD
1.0000 | OROMUCOSAL | Status: DC | PRN
  Administered 2019-06-22: 1 via OROMUCOSAL
  Filled 2019-06-22: qty 177

## 2019-06-22 MED ORDER — CEFAZOLIN SODIUM-DEXTROSE 2-4 GM/100ML-% IV SOLN
2.0000 g | INTRAVENOUS | Status: AC
  Administered 2019-06-22: 2 g via INTRAVENOUS
  Filled 2019-06-22: qty 100

## 2019-06-22 MED ORDER — BUPIVACAINE HCL (PF) 0.25 % IJ SOLN
INTRAMUSCULAR | Status: DC | PRN
  Administered 2019-06-22: 20 mL

## 2019-06-22 MED ORDER — METHOCARBAMOL 500 MG PO TABS
500.0000 mg | ORAL_TABLET | Freq: Two times a day (BID) | ORAL | Status: DC
  Administered 2019-06-22 – 2019-06-23 (×3): 500 mg via ORAL
  Filled 2019-06-22 (×2): qty 1

## 2019-06-22 SURGICAL SUPPLY — 52 items
BAG DECANTER FOR FLEXI CONT (MISCELLANEOUS) ×3 IMPLANT
BAND RUBBER #18 3X1/16 STRL (MISCELLANEOUS) ×6 IMPLANT
BENZOIN TINCTURE PRP APPL 2/3 (GAUZE/BANDAGES/DRESSINGS) ×3 IMPLANT
BLADE CLIPPER SURG (BLADE) ×2 IMPLANT
BUR CUTTER 7.0 ROUND (BURR) ×3 IMPLANT
CANISTER SUCT 3000ML PPV (MISCELLANEOUS) ×3 IMPLANT
CARTRIDGE OIL MAESTRO DRILL (MISCELLANEOUS) ×1 IMPLANT
CLOSURE WOUND 1/2 X4 (GAUZE/BANDAGES/DRESSINGS) ×1
DERMABOND ADVANCED (GAUZE/BANDAGES/DRESSINGS) ×2
DERMABOND ADVANCED .7 DNX12 (GAUZE/BANDAGES/DRESSINGS) ×1 IMPLANT
DIFFUSER DRILL AIR PNEUMATIC (MISCELLANEOUS) ×3 IMPLANT
DRAPE C-ARM 42X72 X-RAY (DRAPES) ×4 IMPLANT
DRAPE HALF SHEET 40X57 (DRAPES) ×2 IMPLANT
DRAPE LAPAROTOMY 100X72X124 (DRAPES) ×3 IMPLANT
DRAPE MICROSCOPE LEICA (MISCELLANEOUS) ×3 IMPLANT
DRAPE SURG 17X23 STRL (DRAPES) ×6 IMPLANT
DRSG OPSITE POSTOP 4X6 (GAUZE/BANDAGES/DRESSINGS) ×2 IMPLANT
DURAPREP 26ML APPLICATOR (WOUND CARE) ×3 IMPLANT
ELECT REM PT RETURN 9FT ADLT (ELECTROSURGICAL) ×3
ELECTRODE REM PT RTRN 9FT ADLT (ELECTROSURGICAL) ×1 IMPLANT
GAUZE 4X4 16PLY RFD (DISPOSABLE) IMPLANT
GAUZE SPONGE 4X4 12PLY STRL (GAUZE/BANDAGES/DRESSINGS) ×3 IMPLANT
GLOVE BIO SURGEON STRL SZ 6.5 (GLOVE) ×3 IMPLANT
GLOVE BIO SURGEONS STRL SZ 6.5 (GLOVE) ×2
GLOVE BIOGEL PI IND STRL 6.5 (GLOVE) ×1 IMPLANT
GLOVE BIOGEL PI IND STRL 7.5 (GLOVE) IMPLANT
GLOVE BIOGEL PI INDICATOR 6.5 (GLOVE) ×4
GLOVE BIOGEL PI INDICATOR 7.5 (GLOVE) ×4
GLOVE ECLIPSE 7.5 STRL STRAW (GLOVE) ×2 IMPLANT
GLOVE ECLIPSE 9.0 STRL (GLOVE) ×3 IMPLANT
GLOVE EXAM NITRILE XL STR (GLOVE) IMPLANT
GOWN STRL REUS W/ TWL LRG LVL3 (GOWN DISPOSABLE) IMPLANT
GOWN STRL REUS W/ TWL XL LVL3 (GOWN DISPOSABLE) ×1 IMPLANT
GOWN STRL REUS W/TWL 2XL LVL3 (GOWN DISPOSABLE) IMPLANT
GOWN STRL REUS W/TWL LRG LVL3 (GOWN DISPOSABLE) ×2
GOWN STRL REUS W/TWL XL LVL3 (GOWN DISPOSABLE) ×8
KIT BASIN OR (CUSTOM PROCEDURE TRAY) ×3 IMPLANT
KIT TURNOVER KIT B (KITS) ×3 IMPLANT
NDL SPNL 22GX3.5 QUINCKE BK (NEEDLE) IMPLANT
NEEDLE HYPO 22GX1.5 SAFETY (NEEDLE) ×3 IMPLANT
NEEDLE SPNL 22GX3.5 QUINCKE BK (NEEDLE) IMPLANT
NS IRRIG 1000ML POUR BTL (IV SOLUTION) ×3 IMPLANT
OIL CARTRIDGE MAESTRO DRILL (MISCELLANEOUS) ×3
PACK LAMINECTOMY NEURO (CUSTOM PROCEDURE TRAY) ×3 IMPLANT
PAD ARMBOARD 7.5X6 YLW CONV (MISCELLANEOUS) ×9 IMPLANT
SPONGE SURGIFOAM ABS GEL SZ50 (HEMOSTASIS) ×3 IMPLANT
STRIP CLOSURE SKIN 1/2X4 (GAUZE/BANDAGES/DRESSINGS) ×2 IMPLANT
SUT VIC AB 2-0 CT1 18 (SUTURE) ×3 IMPLANT
SUT VIC AB 3-0 SH 8-18 (SUTURE) ×3 IMPLANT
TOWEL GREEN STERILE (TOWEL DISPOSABLE) ×3 IMPLANT
TOWEL GREEN STERILE FF (TOWEL DISPOSABLE) ×3 IMPLANT
WATER STERILE IRR 1000ML POUR (IV SOLUTION) ×3 IMPLANT

## 2019-06-22 NOTE — Evaluation (Signed)
Physical Therapy Evaluation Patient Details Name: Bryan Mcguire MRN: [id number] DOB: November 11, 1950 Today's Date: 06/22/2019   History of Present Illness  Patent is a 68 y/o male with PMH positive for CAD, anxiety, DDD, DM, HTN and prior lumbar fusion 2019 and l2-5 decompreesion and fusion in August this year.  He was  admitted with severe spinal stenosis with cord compression and cord signal abnormality at T1-T2.  Now s/p decpompressive laminectomy.  Clinical Impression  Patient presents with decreased mobility due to LE weakness, back precautions and decreased sensation.  Currently S to minguard for mobility and reports son and hired help to assist intermittently.  Discussed plans for safety with keeping cell phone close at all times with recent history of fall at home.  Feel he will need follow up HHPT and aide at d/c.  PT to defer further care to [facility] setting.     Follow Up Recommendations Home health PT(HH aide)    Equipment Recommendations  None recommended by PT    Recommendations for Other Services       Precautions / Restrictions Precautions Precautions: Cervical Precaution Booklet Issued: Yes (comment) Precaution Comments: no brace per orders Restrictions Weight Bearing Restrictions: No      Mobility  Bed Mobility Overal bed mobility: Modified Independent             General bed mobility comments: able to demonstrate log roll and appropriate technique with side to sit and sit to side  Transfers Overall transfer level: Needs assistance Equipment used: Rolling walker (2 wheeled) Transfers: Sit to/from Stand Sit to Stand: Min guard         General transfer comment: initially pulling up on walker, cues for safety with hand placement.  Ambulation/Gait Ambulation/Gait assistance: Supervision Gait Distance (Feet): 250 Feet Assistive device: Rolling walker (2 wheeled) Gait Pattern/deviations: Step-through pattern;Wide base of support;Ataxic     General Gait  Details: mild ataxia with R LE placement during ambulation and noted wider than usual BOS.  Patient with LOB posterior after standing to toilet and backing up with RW min A for recovery  Stairs            Wheelchair Mobility    Modified Rankin (Stroke Patients Only)       Balance Overall balance assessment: Needs assistance   Sitting balance-Leahy Scale: Good       Standing balance-Leahy Scale: Fair Standing balance comment: static balance while urniating in bathroom with S, assist for dynamic mobility versus UE support with one LOB as previously noted                             Pertinent Vitals/Pain Pain Assessment: 0-10 Pain Score: 5  Pain Location: surgical area Pain Descriptors / Indicators: Aching;Sore Pain Intervention(s): Monitored during session;Repositioned;Limited activity within patient's tolerance;Premedicated before session    [place] expects to be discharged to:: Private residence Living Arrangements: Alone Available Help at Discharge: Friend(s);Family;Available PRN/intermittently Type of Home: House Home Access: Stairs to enter   [employer] of Steps: 1 very small basically level entry Home Layout: One level Home Equipment: Shower seat - built in;Cane - single point;Grab bars - tub/shower;Toilet riser;Walker - 2 wheels;Hand held shower head Additional Comments: planning to have help about 3 hours daily for meals, etc and son can come to help intermittently as needed as well    Prior Function           Comments: uses SPC  versus RW for ambulation, recent fall in bathroom legs giving out.     Hand Dominance        Extremity/Trunk Assessment   Upper Extremity Assessment Upper Extremity Assessment: Overall WFL for tasks assessed    Lower Extremity Assessment Lower Extremity Assessment: LLE deficits/detail;RLE deficits/detail RLE Deficits / Details: AROM WFL, strength hip flexion 3/5, knee extension  4/5, ankle DF 4+/5 RLE Sensation: decreased light touch LLE Deficits / Details: AROM WFL, strength hip flexion 3/5, knee extension 4/5, ankle DF 4+/5 LLE Sensation: decreased light touch       Communication   Communication: No difficulties  Cognition Arousal/Alertness: Awake/alert Behavior During Therapy: WFL for tasks assessed/performed Overall Cognitive Status: Within Functional Limits for tasks assessed                                        General Comments General comments (skin integrity, edema, etc.): Issued handout for precautions and pt verbalized understanding    Exercises     Assessment/Plan    PT Assessment All further PT needs can be met in the next venue of care  PT Problem List Decreased strength;Decreased balance;Decreased safety awareness;Decreased knowledge of precautions       PT Treatment Interventions      PT Goals (Current goals can be found in the Care Plan section)  Acute Rehab PT Goals PT Goal Formulation: All assessment and education complete, DC therapy    Frequency     Barriers to discharge        Co-evaluation               AM-PAC PT "6 Clicks" Mobility  Outcome Measure Help needed turning from your back to your side while in a flat bed without using bedrails?: None Help needed moving from lying on your back to sitting on the side of a flat bed without using bedrails?: None Help needed moving to and from a bed to a chair (including a wheelchair)?: None Help needed standing up from a chair using your arms (e.g., wheelchair or bedside chair)?: A Little Help needed to walk in hospital room?: None Help needed climbing 3-5 steps with a railing? : A Little 6 Click Score: 22    End of Session   Activity Tolerance: Patient tolerated treatment well Patient left: in bed;with call bell/phone within reach   PT Visit Diagnosis: Other abnormalities of gait and mobility (R26.89);Muscle weakness (generalized) (M62.81)     Time: 1537-9432 PT Time Calculation (min) (ACUTE ONLY): 25 min   Charges:   PT Evaluation $PT Eval Low Complexity: 1 Low PT Treatments $Gait Training: 8-22 mins        Bryan Mcguire, [place] [facility] [phone] 06/22/2019   Bryan Mcguire 06/22/2019, 4:06 PM

## 2019-06-22 NOTE — Brief Op Note (Signed)
06/22/2019  9:53 AM  PATIENT:  Bryan Mcguire  68 y.o. male  PRE-OPERATIVE DIAGNOSIS:  Thoracic myelopathy  POST-OPERATIVE DIAGNOSIS:  Thoracic myelopathy  PROCEDURE:  Procedure(s): Thoracic One- Two Laminectomy (N/A)  SURGEON:  Surgeon(s) and Role:    * Earnie Larsson, MD - Primary  PHYSICIAN ASSISTANT:   ASSISTANTSMearl Latin   ANESTHESIA:   general  EBL:  100 mL   BLOOD ADMINISTERED:none  DRAINS: none   LOCAL MEDICATIONS USED:  MARCAINE     SPECIMEN:  No Specimen  DISPOSITION OF SPECIMEN:  N/A  COUNTS:  YES  TOURNIQUET:  * No tourniquets in log *  DICTATION: .Dragon Dictation  PLAN OF CARE: Admit for overnight observation  PATIENT DISPOSITION:  PACU - hemodynamically stable.   Delay start of Pharmacological VTE agent (>24hrs) due to surgical blood loss or risk of bleeding: yes

## 2019-06-22 NOTE — Op Note (Signed)
Date of procedure: 06/22/2019  Date of dictation: Same  Service: Neurosurgery  Preoperative diagnosis: T1-T2 stenosis with myelopathy  Postoperative diagnosis: Same  Procedure Name: T1-T2 decompressive laminectomy  Surgeon:Brenlee Koskela A.Deniya Craigo, M.D.  Asst. Surgeon: Reinaldo Meeker, NP  Anesthesia: General  Indication: 68 year old male with bilateral lower extremity weakness and sensory loss consistent with thoracic myelopathy.  Work-up demonstrates evidence of severe spinal stenosis with cord compression and cord signal abnormality at T1-T2.  Patient presents now for decompressive surgery.  Operative note: After induction of anesthesia, patient position prone onto bolsters with his head fixed in [place] pin headrest.  Patient's posterior cervical region and upper thoracic region prepped and draped sterilely.  Incision made overlying T1-2.  Dissection performed bilaterally.  Retractor placed.  Fluoroscopy used.  Level confirmed.  Decompressive laminectomy was then performed using Leksell rongeurs Kerrison rongeurs and high-speed drill to remove the entire lamina of T1, the superior two thirds of the lamina of T2, and the medial aspect of the T1T2 facet joint bilaterally.  Ligamentum flavum was elevated and resected.  A generous central decompression was achieved.  There was no evidence of injury to the thecal sac and nerve roots or spinal cord.  Wound was then irrigated with antibiotic solution.  Gelfoam was placed topically for hemostasis.  Wounds and closed in layers with Vicryl sutures.  Steri-Strips and sterile dressing were applied.  No apparent complications.  Patient tolerated the procedure well and he returned to the recovery room postop.

## 2019-06-22 NOTE — Anesthesia Procedure Notes (Signed)
Procedure Name: Intubation Date/Time: 06/22/2019 8:07 AM Performed by: Wilburn Cornelia, CRNA Pre-anesthesia Checklist: Emergency Drugs available, Patient identified, Suction available, Patient being monitored and Timeout performed Patient Re-evaluated:Patient Re-evaluated prior to induction Oxygen Delivery Method: Circle system utilized Preoxygenation: Pre-oxygenation with 100% oxygen Induction Type: IV induction Ventilation: Mask ventilation without difficulty Laryngoscope Size: Mac and 4 Grade View: Grade I Tube type: Oral Tube size: 7.5 mm Number of attempts: 1 Airway Equipment and Method: Stylet Placement Confirmation: ETT inserted through vocal cords under direct vision,  positive ETCO2,  CO2 detector and breath sounds checked- equal and bilateral Secured at: 23 cm Tube secured with: Tape Dental Injury: Teeth and Oropharynx as per pre-operative assessment
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
## 2019-06-22 NOTE — Anesthesia Postprocedure Evaluation (Signed)
Anesthesia Post Note  Patient: Bryan Mcguire  Procedure(s) Performed: Thoracic One- Two Laminectomy (N/A Spine Thoracic)     Patient location during evaluation: PACU Anesthesia Type: General Level of consciousness: awake and alert Pain management: pain level controlled Vital Signs Assessment: post-procedure vital signs reviewed and stable Respiratory status: spontaneous breathing, nonlabored ventilation, respiratory function stable and patient connected to nasal cannula oxygen Cardiovascular status: blood pressure returned to baseline and stable Postop Assessment: no apparent nausea or vomiting Anesthetic complications: no    Last Vitals:  Vitals:   06/22/19 1200 06/22/19 1225  BP:  136/77  Pulse:  65  Resp:  16  Temp: 36.6 C 36.6 C  SpO2:  100%    Last Pain:  Vitals:   06/22/19 1253  TempSrc:   PainSc: 4                  Ryan P Ellender

## 2019-06-22 NOTE — Transfer of Care (Signed)
Immediate Anesthesia Transfer of Care Note  Patient: Bryan Mcguire  Procedure(s) Performed: Thoracic One- Two Laminectomy (N/A Spine Thoracic)  Patient Location: PACU  Anesthesia Type:General  Level of Consciousness: awake, alert  and oriented  Airway & Oxygen Therapy: Patient Spontanous Breathing and Patient connected to nasal cannula oxygen  Post-op Assessment: Report given to RN and Post -op Vital signs reviewed and stable  Post vital signs: Reviewed and stable  Last Vitals:  Vitals Value Taken Time  BP    Temp    Pulse    Resp    SpO2      Last Pain:  Vitals:   06/22/19 0714  TempSrc:   PainSc: 4       Patients Stated Pain Goal: 2 ([id number])  Complications: No apparent anesthesia complications

## 2019-06-23 ENCOUNTER — Encounter (HOSPITAL_COMMUNITY): Payer: Self-pay | Admitting: Neurosurgery

## 2019-06-23 DIAGNOSIS — M4714 Other spondylosis with myelopathy, thoracic region: Secondary | ICD-10-CM | POA: Diagnosis not present

## 2019-06-23 LAB — GLUCOSE, CAPILLARY: Glucose-Capillary: 146 mg/dL — ABNORMAL HIGH (ref 70–99)

## 2019-06-23 NOTE — TOC Initial Note (Signed)
Transition of Care [facility]) - Initial/Assessment Note    Patient Details  Name: Bryan Mcguire MRN: [id number] Date of Birth: February 21, 1951  [facility] CM/SW Contact:    Benard Halsted, LCSW Phone Number: 06/23/2019, 8:41 AM  Clinical Narrative:                 CSW received consult for possible home health services at time of discharge. CSW spoke with patient regarding PT recommendation of Home Health PT/OT at time of discharge. Patient reported that he would like home health services.  Patient reports preference for [facility] since he has used them before and liked their care. CSW sent referral for review and they are able to accept patient. CSW provided Medicare [facility] ratings list. CSW confirmed PCP and address with patient. No further questions reported at this time.    Expected Discharge Plan: [place] Barriers to Discharge: No Barriers Identified   Patient Goals and CMS Choice Patient states their goals for this hospitalization and ongoing recovery are:: Return home CMS Medicare.gov Compare Post Acute Care list provided to:: Patient Choice offered to / list presented to : Patient  Expected Discharge Plan and Services Expected Discharge Plan: [place] In-house Referral: NA Discharge Planning Services: CM Consult Post Acute Care Choice: [place] arrangements for the past 2 months: [place] Expected Discharge Date: 06/23/19                         HH Arranged: PT, OT HH Agency: [place] Date [place]: 06/23/19 Time [place]: [phone]    Prior Living Arrangements/Services Living arrangements for the past 2 months: [place] with:: Self Patient language and need for interpreter reviewed:: Yes Do you feel safe going back to the place where you live?: Yes      Need for Family Participation in Patient Care: No (Comment) Care giver support  system in place?: Yes (comment)   Criminal Activity/Legal Involvement Pertinent to Current Situation/Hospitalization: No - Comment as needed  Activities of Daily Living Home Assistive Devices/Equipment: Walker (specify type), Cane (specify quad or straight), Hearing aid, CBG Meter, Blood pressure cuff, Dentures (specify type) ADL Screening (condition at time of admission) Patient's cognitive ability adequate to safely complete daily activities?: Yes Is the patient deaf or have difficulty hearing?: Yes(bilateral hearing aids ) Does the patient have difficulty seeing, even when wearing glasses/contacts?: No Does the patient have difficulty concentrating, remembering, or making decisions?: No Patient able to express need for assistance with ADLs?: Yes Does the patient have difficulty dressing or bathing?: No Independently performs ADLs?: Yes (appropriate for developmental age) Does the patient have difficulty walking or climbing stairs?: Yes Weakness of Legs: Both Weakness of Arms/Hands: None  Permission Sought/Granted Permission sought to share information with : Investment banker, corporate granted to share info w AGENCY: [facility]        Emotional Assessment Appearance:: Appears stated age Attitude/Demeanor/Rapport: Engaged Affect (typically observed): Accepting, Appropriate Orientation: : Oriented to Self, Oriented to Place, Oriented to  Time, Oriented to Situation Alcohol / Substance Use: Not Applicable Psych Involvement: No (comment)  Admission diagnosis:  Thoracic myelopathy Patient Active Problem List   Diagnosis Date Noted  . Myelopathy concurrent with and due to spinal stenosis of thoracic region ([place]) 06/22/2019  . Acute post-operative pain 04/12/2019  .  Lumbar stenosis with neurogenic claudication 03/26/2019  . Palpitation 07/21/2018  . Degenerative spondylolisthesis 03/20/2018  . Coronary artery disease involving native coronary  artery of native heart with angina pectoris ([place]) 01/29/2016  . Essential hypertension 01/29/2016  . Hyperlipidemia 01/29/2016  . PVC (premature ventricular contraction) 01/29/2016   PCP:  Myrlene Broker, MD Pharmacy:   [facility], [place] [facility] [place] [place] [address] Phone: [phone] Fax: [phone]     Social Determinants of Health (SDOH) Interventions    Readmission Risk Interventions No flowsheet data found.

## 2019-06-23 NOTE — Discharge Summary (Signed)
Physician Discharge Summary  Patient ID: Bryan Mcguire MRN: [id number] DOB/AGE: 04/30/1951 68 y.o.  Admit date: 06/22/2019 Discharge date: 06/23/2019  Admission Diagnoses:  Discharge Diagnoses:  Active Problems:   Myelopathy concurrent with and due to spinal stenosis of thoracic region [facility])   Discharged Condition: good  Hospital Course: Patient admitted to the hospital where he underwent uncomplicated thoracic decompressive laminectomy.  Postoperatively improved.  Standing and walking better.  Better sensation in his trunk and lower extremities.  Feels more stable on his feet.  Voiding well.  Ready for discharge home.  Consults:   Significant Diagnostic Studies:   Treatments:   Discharge Exam: Blood pressure 93/69, pulse 80, temperature 98 F (36.7 C), temperature source Oral, resp. rate 16, height 5\' 8"  (1.727 m), weight 73.5 kg, SpO2 98 %. Awake and alert.  Oriented and appropriate.  Cranial nerve function intact.  Motor and sensory function of the extremities stable.  Wound clean and dry.  Chest and abdomen benign.  Disposition: Discharge disposition: 01-Home or Self Care        Allergies as of 06/23/2019      Reactions   Duloxetine Other (See Comments)   "jittery nervous feeling"      Medication List    TAKE these medications   ALPRAZolam 0.25 MG tablet Commonly known as: XANAX Take 0.25 mg by mouth 3 (three) times daily as needed for anxiety.   aspirin EC 81 MG tablet Take 81 mg by mouth daily.   docusate sodium 100 MG capsule Commonly known as: COLACE Take 1 capsule (100 mg total) by mouth 2 (two) times daily. What changed:   when to take this  reasons to take this   fluticasone 50 MCG/ACT nasal spray Commonly known as: FLONASE Place 1-2 sprays into both nostrils daily as needed for allergies.   HYDROcodone-acetaminophen 10-325 MG tablet Commonly known as: NORCO Take 1 tablet by mouth every 4 (four) hours as needed (pain). What changed: when  to take this   Jardiance 10 MG Tabs tablet Generic drug: empagliflozin Take 10 mg by mouth daily in the afternoon.   metFORMIN 1000 MG tablet Commonly known as: GLUCOPHAGE Take 1,000 mg by mouth 2 (two) times daily with a meal. Breakfast & supper   methocarbamol 500 MG tablet Commonly known as: ROBAXIN Take 500 mg by mouth 2 (two) times daily.   ondansetron 4 MG tablet Commonly known as: ZOFRAN Take 4 mg by mouth every 6 (six) hours as needed for nausea/vomiting.   pantoprazole 40 MG tablet Commonly known as: PROTONIX Take 40 mg by mouth daily.   pravastatin 10 MG tablet Commonly known as: PRAVACHOL Take 10 mg by mouth daily in the afternoon.   pregabalin 75 MG capsule Commonly known as: LYRICA Take 75 mg by mouth 3 (three) times daily.   Vitamin B-12 5000 MCG Tbdp Take 5,000 mcg by mouth daily.   Vitamin D (Ergocalciferol) 1.25 MG (50000 UT) Caps capsule Commonly known as: DRISDOL Take 50,000 Units by mouth every Tuesday.   zolpidem 10 MG tablet Commonly known as: AMBIEN Take 10 mg by mouth at bedtime.        Signed: Cooper Render Niara Bunker 06/23/2019, 8:26 AM

## 2019-06-23 NOTE — Evaluation (Signed)
Occupational Therapy Evaluation Patient Details Name: Bryan Mcguire MRN: [id number] DOB: 1951-08-24 Today's Date: 06/23/2019    History of Present Illness Patent is a 68 y/o male with PMH positive for CAD, anxiety, DDD, DM, HTN and prior lumbar fusion 2019 and l2-5 decompreesion and fusion in August this year.  He was  admitted with severe spinal stenosis with cord compression and cord signal abnormality at T1-T2.  Now s/p decpompressive laminectomy.   Clinical Impression   PTA patient independent. Admitted for above and limited by impaired balance, decreased activity tolerance, back precautions.  Educated on precautions, ADL compensatory techniques, use of DME, safety recommendations.  Pt currently requires supervision for LB ADLs, in room mobility and transfers.  Min cueing for safety and precaution adherence during session. Patient will benefit from further OT services at [facility] level in order to maximize independence and safety with ADLs/IADls, as pt lives alone. Will defer OT to [facility] as patient dcing home today.     Follow Up Recommendations  Supervision - Intermittent;Home health OT    Equipment Recommendations  None recommended by OT    Recommendations for Other Services       Precautions / Restrictions Precautions Precautions: Cervical Precaution Booklet Issued: Yes (comment) Precaution Comments: no brace per orders Restrictions Weight Bearing Restrictions: No      Mobility Bed Mobility Overal bed mobility: Modified Independent             General bed mobility comments: no assist required, good recall of log roll technique  Transfers Overall transfer level: Needs assistance Equipment used: Rolling walker (2 wheeled) Transfers: Sit to/from Stand Sit to Stand: Supervision         General transfer comment: supervision for safety, cueing for hand placement (pulling on RW initally)     Balance Overall balance assessment: Needs assistance   Sitting balance-Leahy  Scale: Good     Standing balance support: Bilateral upper extremity supported;No upper extremity supported;During functional activity Standing balance-Leahy Scale: Fair Standing balance comment: able to engage in ADLs without UE support statically, but relaint on BUE support dynamically                           ADL either performed or assessed with clinical judgement   ADL Overall ADL's : Needs assistance/impaired     Grooming: Supervision/safety;Standing;Cueing for compensatory techniques   Upper Body Bathing: Set up;Sitting   Lower Body Bathing: Supervison/ safety;Sit to/from stand   Upper Body Dressing : Set up;Sitting   Lower Body Dressing: Supervision/safety;Set up;Sit to/from stand   Toilet Transfer: Supervision/safety;Ambulation;RW Toilet Transfer Details (indicate cue type and reason): simulated to [facility] (to simulated toilet riser at home)   Toileting - Clothing Manipulation Details (indicate cue type and reason): verbally reviewed and demonstrated technique to adhere to BLT    Tub/Shower Transfer Details (indicate cue type and reason): pt verbalized technique, using grabbars and 1/2 wall  Functional mobility during ADLs: Supervision/safety;Rolling walker General ADL Comments: pt limited by impaired balance, back precautions     Vision         Perception     Praxis      Pertinent Vitals/Pain Pain Assessment: Faces Faces Pain Scale: Hurts a little bit Pain Location: surgical area Pain Descriptors / Indicators: Aching;Sore;Operative site guarding Pain Intervention(s): Monitored during session;Repositioned     Hand Dominance     Extremity/Trunk Assessment Upper Extremity Assessment Upper Extremity Assessment: Overall WFL for tasks assessed   Lower  Extremity Assessment Lower Extremity Assessment: Defer to PT evaluation   Cervical / Trunk Assessment Cervical / Trunk Assessment: Other exceptions Cervical / Trunk Exceptions: s/p throracic sx    Communication Communication Communication: No difficulties   Cognition Arousal/Alertness: Awake/alert Behavior During Therapy: WFL for tasks assessed/performed Overall Cognitive Status: Within Functional Limits for tasks assessed                                     General Comments       Exercises     Shoulder Instructions      Home Living Family/patient expects to be discharged to:: Private residence Living Arrangements: Alone Available Help at Discharge: Friend(s);Family;Available PRN/intermittently Type of Home: House Home Access: Stairs to enter [employer] of Steps: 1 very small basically level entry   Home Layout: One level     Bathroom Shower/Tub: Occupational psychologist: Standard     Home Equipment: Shower seat - built in;Cane - single point;Grab bars - tub/shower;Toilet riser;Walker - 2 wheels;Hand held shower head   Additional Comments: planning to have help about 3 hours daily for meals, etc and son can come to help intermittently as needed as well      Prior Functioning/Environment Level of Independence: Independent with assistive device(s)        Comments: uses SPC versus RW for ambulation, recent fall in bathroom legs giving out; independent ADLs not driving         OT Problem List: Decreased activity tolerance;Impaired balance (sitting and/or standing);Decreased safety awareness;Decreased knowledge of use of DME or AE;Decreased knowledge of precautions;Pain      OT Treatment/Interventions:      OT Goals(Current goals can be found in the care plan section) Acute Rehab OT Goals Patient Stated Goal: home today OT Goal Formulation: With patient  OT Frequency:     Barriers to D/C:            Co-evaluation              AM-PAC OT "6 Clicks" Daily Activity     Outcome Measure Help from another person eating meals?: None Help from another person taking care of personal grooming?: A Little Help from  another person toileting, which includes using toliet, bedpan, or urinal?: A Little Help from another person bathing (including washing, rinsing, drying)?: A Little Help from another person to put on and taking off regular upper body clothing?: A Little Help from another person to put on and taking off regular lower body clothing?: A Little 6 Click Score: 19   End of Session Equipment Utilized During Treatment: Rolling walker Nurse Communication: Mobility status  Activity Tolerance: Patient tolerated treatment well Patient left: in bed;with call bell/phone within reach  OT Visit Diagnosis: Unsteadiness on feet (R26.81);Pain Pain - part of body: (surgical)                Time: 0805-0829 OT Time Calculation (min): 24 min Charges:  OT General Charges $OT Visit: 1 Visit OT Evaluation $OT Eval Low Complexity: [place], OT Acute Rehabilitation Services Pager [phone] Office [phone]    Delight Stare 06/23/2019, 9:05 AM

## 2019-06-23 NOTE — Discharge Instructions (Signed)

## 2019-06-23 NOTE — Plan of Care (Signed)
Patient alert and oriented, mae's well, voiding adequate amount of urine, swallowing without difficulty, no c/o pain at time of discharge. Patient discharged home with family. Script and discharged instructions given to patient. Patient and family stated understanding of instructions given. Patient has an appointment with Dr. Pool  

## 2019-06-26 DIAGNOSIS — M48062 Spinal stenosis, lumbar region with neurogenic claudication: Secondary | ICD-10-CM | POA: Diagnosis not present

## 2019-06-26 DIAGNOSIS — I252 Old myocardial infarction: Secondary | ICD-10-CM | POA: Diagnosis not present

## 2019-06-26 DIAGNOSIS — Z981 Arthrodesis status: Secondary | ICD-10-CM | POA: Diagnosis not present

## 2019-06-26 DIAGNOSIS — M1990 Unspecified osteoarthritis, unspecified site: Secondary | ICD-10-CM | POA: Diagnosis not present

## 2019-06-26 DIAGNOSIS — G4730 Sleep apnea, unspecified: Secondary | ICD-10-CM | POA: Diagnosis not present

## 2019-06-26 DIAGNOSIS — F419 Anxiety disorder, unspecified: Secondary | ICD-10-CM | POA: Diagnosis not present

## 2019-06-26 DIAGNOSIS — M5416 Radiculopathy, lumbar region: Secondary | ICD-10-CM | POA: Diagnosis not present

## 2019-06-26 DIAGNOSIS — M4804 Spinal stenosis, thoracic region: Secondary | ICD-10-CM | POA: Diagnosis not present

## 2019-06-26 DIAGNOSIS — E119 Type 2 diabetes mellitus without complications: Secondary | ICD-10-CM | POA: Diagnosis not present

## 2019-06-26 DIAGNOSIS — Z7984 Long term (current) use of oral hypoglycemic drugs: Secondary | ICD-10-CM | POA: Diagnosis not present

## 2019-06-26 DIAGNOSIS — M961 Postlaminectomy syndrome, not elsewhere classified: Secondary | ICD-10-CM | POA: Diagnosis not present

## 2019-06-26 DIAGNOSIS — R2689 Other abnormalities of gait and mobility: Secondary | ICD-10-CM | POA: Diagnosis not present

## 2019-06-26 DIAGNOSIS — Z7982 Long term (current) use of aspirin: Secondary | ICD-10-CM | POA: Diagnosis not present

## 2019-06-26 DIAGNOSIS — Z79899 Other long term (current) drug therapy: Secondary | ICD-10-CM | POA: Diagnosis not present

## 2019-06-26 DIAGNOSIS — E785 Hyperlipidemia, unspecified: Secondary | ICD-10-CM | POA: Diagnosis not present

## 2019-06-26 DIAGNOSIS — R2681 Unsteadiness on feet: Secondary | ICD-10-CM | POA: Diagnosis not present

## 2019-06-26 DIAGNOSIS — I25119 Atherosclerotic heart disease of native coronary artery with unspecified angina pectoris: Secondary | ICD-10-CM | POA: Diagnosis not present

## 2019-06-26 DIAGNOSIS — Z4789 Encounter for other orthopedic aftercare: Secondary | ICD-10-CM | POA: Diagnosis not present

## 2019-06-26 DIAGNOSIS — M4310 Spondylolisthesis, site unspecified: Secondary | ICD-10-CM | POA: Diagnosis not present

## 2019-06-26 DIAGNOSIS — R002 Palpitations: Secondary | ICD-10-CM | POA: Diagnosis not present

## 2019-06-26 DIAGNOSIS — I10 Essential (primary) hypertension: Secondary | ICD-10-CM | POA: Diagnosis not present

## 2019-06-26 DIAGNOSIS — R262 Difficulty in walking, not elsewhere classified: Secondary | ICD-10-CM | POA: Diagnosis not present

## 2019-06-26 DIAGNOSIS — Z87891 Personal history of nicotine dependence: Secondary | ICD-10-CM | POA: Diagnosis not present

## 2019-06-26 DIAGNOSIS — K219 Gastro-esophageal reflux disease without esophagitis: Secondary | ICD-10-CM | POA: Diagnosis not present

## 2019-07-26 DIAGNOSIS — M4310 Spondylolisthesis, site unspecified: Secondary | ICD-10-CM | POA: Diagnosis not present

## 2019-07-26 DIAGNOSIS — F419 Anxiety disorder, unspecified: Secondary | ICD-10-CM | POA: Diagnosis not present

## 2019-07-26 DIAGNOSIS — M961 Postlaminectomy syndrome, not elsewhere classified: Secondary | ICD-10-CM | POA: Diagnosis not present

## 2019-07-26 DIAGNOSIS — R262 Difficulty in walking, not elsewhere classified: Secondary | ICD-10-CM | POA: Diagnosis not present

## 2019-07-26 DIAGNOSIS — Z7984 Long term (current) use of oral hypoglycemic drugs: Secondary | ICD-10-CM | POA: Diagnosis not present

## 2019-07-26 DIAGNOSIS — M48062 Spinal stenosis, lumbar region with neurogenic claudication: Secondary | ICD-10-CM | POA: Diagnosis not present

## 2019-07-26 DIAGNOSIS — Z79899 Other long term (current) drug therapy: Secondary | ICD-10-CM | POA: Diagnosis not present

## 2019-07-26 DIAGNOSIS — Z87891 Personal history of nicotine dependence: Secondary | ICD-10-CM | POA: Diagnosis not present

## 2019-07-26 DIAGNOSIS — K219 Gastro-esophageal reflux disease without esophagitis: Secondary | ICD-10-CM | POA: Diagnosis not present

## 2019-07-26 DIAGNOSIS — I25119 Atherosclerotic heart disease of native coronary artery with unspecified angina pectoris: Secondary | ICD-10-CM | POA: Diagnosis not present

## 2019-07-26 DIAGNOSIS — M1990 Unspecified osteoarthritis, unspecified site: Secondary | ICD-10-CM | POA: Diagnosis not present

## 2019-07-26 DIAGNOSIS — M5416 Radiculopathy, lumbar region: Secondary | ICD-10-CM | POA: Diagnosis not present

## 2019-07-26 DIAGNOSIS — R2681 Unsteadiness on feet: Secondary | ICD-10-CM | POA: Diagnosis not present

## 2019-07-26 DIAGNOSIS — E119 Type 2 diabetes mellitus without complications: Secondary | ICD-10-CM | POA: Diagnosis not present

## 2019-07-26 DIAGNOSIS — Z7982 Long term (current) use of aspirin: Secondary | ICD-10-CM | POA: Diagnosis not present

## 2019-07-26 DIAGNOSIS — I10 Essential (primary) hypertension: Secondary | ICD-10-CM | POA: Diagnosis not present

## 2019-07-26 DIAGNOSIS — I252 Old myocardial infarction: Secondary | ICD-10-CM | POA: Diagnosis not present

## 2019-07-26 DIAGNOSIS — R2689 Other abnormalities of gait and mobility: Secondary | ICD-10-CM | POA: Diagnosis not present

## 2019-07-26 DIAGNOSIS — Z981 Arthrodesis status: Secondary | ICD-10-CM | POA: Diagnosis not present

## 2019-07-26 DIAGNOSIS — Z4789 Encounter for other orthopedic aftercare: Secondary | ICD-10-CM | POA: Diagnosis not present

## 2019-07-26 DIAGNOSIS — M4804 Spinal stenosis, thoracic region: Secondary | ICD-10-CM | POA: Diagnosis not present

## 2019-07-26 DIAGNOSIS — R002 Palpitations: Secondary | ICD-10-CM | POA: Diagnosis not present

## 2019-07-26 DIAGNOSIS — E785 Hyperlipidemia, unspecified: Secondary | ICD-10-CM | POA: Diagnosis not present

## 2019-07-26 DIAGNOSIS — G4730 Sleep apnea, unspecified: Secondary | ICD-10-CM | POA: Diagnosis not present

## 2019-10-05 DIAGNOSIS — L300 Nummular dermatitis: Secondary | ICD-10-CM | POA: Diagnosis not present

## 2019-10-05 DIAGNOSIS — L299 Pruritus, unspecified: Secondary | ICD-10-CM | POA: Diagnosis not present

## 2019-10-05 DIAGNOSIS — L820 Inflamed seborrheic keratosis: Secondary | ICD-10-CM | POA: Diagnosis not present

## 2019-10-13 DIAGNOSIS — M48062 Spinal stenosis, lumbar region with neurogenic claudication: Secondary | ICD-10-CM | POA: Diagnosis not present

## 2019-10-27 DIAGNOSIS — M5126 Other intervertebral disc displacement, lumbar region: Secondary | ICD-10-CM | POA: Diagnosis not present

## 2019-10-27 DIAGNOSIS — M48062 Spinal stenosis, lumbar region with neurogenic claudication: Secondary | ICD-10-CM | POA: Diagnosis not present

## 2019-10-27 DIAGNOSIS — M48061 Spinal stenosis, lumbar region without neurogenic claudication: Secondary | ICD-10-CM | POA: Diagnosis not present

## 2019-11-04 DIAGNOSIS — M48062 Spinal stenosis, lumbar region with neurogenic claudication: Secondary | ICD-10-CM | POA: Diagnosis not present

## 2019-12-20 DIAGNOSIS — G8929 Other chronic pain: Secondary | ICD-10-CM | POA: Diagnosis not present

## 2019-12-20 DIAGNOSIS — Z0000 Encounter for general adult medical examination without abnormal findings: Secondary | ICD-10-CM | POA: Diagnosis not present

## 2019-12-20 DIAGNOSIS — J019 Acute sinusitis, unspecified: Secondary | ICD-10-CM | POA: Diagnosis not present

## 2019-12-20 DIAGNOSIS — M5442 Lumbago with sciatica, left side: Secondary | ICD-10-CM | POA: Diagnosis not present

## 2019-12-20 DIAGNOSIS — M5137 Other intervertebral disc degeneration, lumbosacral region: Secondary | ICD-10-CM | POA: Diagnosis not present

## 2019-12-20 DIAGNOSIS — G6189 Other inflammatory polyneuropathies: Secondary | ICD-10-CM | POA: Diagnosis not present

## 2019-12-20 DIAGNOSIS — I10 Essential (primary) hypertension: Secondary | ICD-10-CM | POA: Diagnosis not present

## 2019-12-20 DIAGNOSIS — E782 Mixed hyperlipidemia: Secondary | ICD-10-CM | POA: Diagnosis not present

## 2019-12-20 DIAGNOSIS — E1165 Type 2 diabetes mellitus with hyperglycemia: Secondary | ICD-10-CM | POA: Diagnosis not present

## 2020-02-03 DIAGNOSIS — M48062 Spinal stenosis, lumbar region with neurogenic claudication: Secondary | ICD-10-CM | POA: Diagnosis not present

## 2020-05-11 DIAGNOSIS — M48062 Spinal stenosis, lumbar region with neurogenic claudication: Secondary | ICD-10-CM | POA: Diagnosis not present

## 2020-05-11 DIAGNOSIS — M4714 Other spondylosis with myelopathy, thoracic region: Secondary | ICD-10-CM | POA: Diagnosis not present

## 2020-05-16 DIAGNOSIS — R200 Anesthesia of skin: Secondary | ICD-10-CM | POA: Diagnosis not present

## 2020-05-16 DIAGNOSIS — M4714 Other spondylosis with myelopathy, thoracic region: Secondary | ICD-10-CM | POA: Diagnosis not present

## 2020-05-16 DIAGNOSIS — S22088A Other fracture of T11-T12 vertebra, initial encounter for closed fracture: Secondary | ICD-10-CM | POA: Diagnosis not present

## 2020-05-16 DIAGNOSIS — R202 Paresthesia of skin: Secondary | ICD-10-CM | POA: Diagnosis not present

## 2020-05-16 DIAGNOSIS — M48062 Spinal stenosis, lumbar region with neurogenic claudication: Secondary | ICD-10-CM | POA: Diagnosis not present

## 2020-05-16 DIAGNOSIS — M48061 Spinal stenosis, lumbar region without neurogenic claudication: Secondary | ICD-10-CM | POA: Diagnosis not present

## 2020-05-16 DIAGNOSIS — M5126 Other intervertebral disc displacement, lumbar region: Secondary | ICD-10-CM | POA: Diagnosis not present

## 2020-05-16 DIAGNOSIS — M4314 Spondylolisthesis, thoracic region: Secondary | ICD-10-CM | POA: Diagnosis not present

## 2020-05-18 DIAGNOSIS — M4714 Other spondylosis with myelopathy, thoracic region: Secondary | ICD-10-CM | POA: Diagnosis not present

## 2020-07-12 DIAGNOSIS — R1032 Left lower quadrant pain: Secondary | ICD-10-CM | POA: Diagnosis not present

## 2020-07-12 DIAGNOSIS — R519 Headache, unspecified: Secondary | ICD-10-CM | POA: Diagnosis not present

## 2020-07-12 DIAGNOSIS — Z20822 Contact with and (suspected) exposure to covid-19: Secondary | ICD-10-CM | POA: Diagnosis not present

## 2020-08-30 DIAGNOSIS — Z6826 Body mass index (BMI) 26.0-26.9, adult: Secondary | ICD-10-CM | POA: Diagnosis not present

## 2020-08-30 DIAGNOSIS — M4714 Other spondylosis with myelopathy, thoracic region: Secondary | ICD-10-CM | POA: Diagnosis not present

## 2020-08-30 DIAGNOSIS — R030 Elevated blood-pressure reading, without diagnosis of hypertension: Secondary | ICD-10-CM | POA: Diagnosis not present

## 2020-10-05 DIAGNOSIS — L821 Other seborrheic keratosis: Secondary | ICD-10-CM | POA: Diagnosis not present

## 2020-10-05 DIAGNOSIS — R0981 Nasal congestion: Secondary | ICD-10-CM | POA: Diagnosis not present

## 2020-10-05 DIAGNOSIS — L570 Actinic keratosis: Secondary | ICD-10-CM | POA: Diagnosis not present

## 2020-10-05 DIAGNOSIS — L219 Seborrheic dermatitis, unspecified: Secondary | ICD-10-CM | POA: Diagnosis not present

## 2020-10-05 DIAGNOSIS — F418 Other specified anxiety disorders: Secondary | ICD-10-CM | POA: Diagnosis not present

## 2020-10-05 DIAGNOSIS — L578 Other skin changes due to chronic exposure to nonionizing radiation: Secondary | ICD-10-CM | POA: Diagnosis not present

## 2020-11-29 DIAGNOSIS — M4714 Other spondylosis with myelopathy, thoracic region: Secondary | ICD-10-CM | POA: Diagnosis not present

## 2020-12-06 DIAGNOSIS — J189 Pneumonia, unspecified organism: Secondary | ICD-10-CM | POA: Insufficient documentation

## 2020-12-06 DIAGNOSIS — M5136 Other intervertebral disc degeneration, lumbar region: Secondary | ICD-10-CM | POA: Insufficient documentation

## 2020-12-06 DIAGNOSIS — D649 Anemia, unspecified: Secondary | ICD-10-CM | POA: Insufficient documentation

## 2020-12-06 DIAGNOSIS — G629 Polyneuropathy, unspecified: Secondary | ICD-10-CM | POA: Insufficient documentation

## 2020-12-06 DIAGNOSIS — M1990 Unspecified osteoarthritis, unspecified site: Secondary | ICD-10-CM | POA: Insufficient documentation

## 2020-12-06 DIAGNOSIS — Z972 Presence of dental prosthetic device (complete) (partial): Secondary | ICD-10-CM | POA: Insufficient documentation

## 2020-12-06 DIAGNOSIS — G4730 Sleep apnea, unspecified: Secondary | ICD-10-CM | POA: Insufficient documentation

## 2020-12-06 DIAGNOSIS — I2510 Atherosclerotic heart disease of native coronary artery without angina pectoris: Secondary | ICD-10-CM | POA: Insufficient documentation

## 2020-12-06 DIAGNOSIS — J302 Other seasonal allergic rhinitis: Secondary | ICD-10-CM | POA: Insufficient documentation

## 2020-12-06 DIAGNOSIS — F419 Anxiety disorder, unspecified: Secondary | ICD-10-CM | POA: Insufficient documentation

## 2020-12-06 DIAGNOSIS — K219 Gastro-esophageal reflux disease without esophagitis: Secondary | ICD-10-CM | POA: Insufficient documentation

## 2020-12-06 DIAGNOSIS — M51369 Other intervertebral disc degeneration, lumbar region without mention of lumbar back pain or lower extremity pain: Secondary | ICD-10-CM | POA: Insufficient documentation

## 2020-12-06 DIAGNOSIS — E119 Type 2 diabetes mellitus without complications: Secondary | ICD-10-CM | POA: Insufficient documentation

## 2020-12-06 DIAGNOSIS — I10 Essential (primary) hypertension: Secondary | ICD-10-CM | POA: Insufficient documentation

## 2020-12-06 DIAGNOSIS — E785 Hyperlipidemia, unspecified: Secondary | ICD-10-CM | POA: Insufficient documentation

## 2020-12-15 DIAGNOSIS — Z1211 Encounter for screening for malignant neoplasm of colon: Secondary | ICD-10-CM | POA: Diagnosis not present

## 2020-12-15 DIAGNOSIS — J340 Abscess, furuncle and carbuncle of nose: Secondary | ICD-10-CM | POA: Diagnosis not present

## 2020-12-15 DIAGNOSIS — I10 Essential (primary) hypertension: Secondary | ICD-10-CM | POA: Diagnosis not present

## 2020-12-15 DIAGNOSIS — R0789 Other chest pain: Secondary | ICD-10-CM | POA: Diagnosis not present

## 2020-12-15 DIAGNOSIS — R55 Syncope and collapse: Secondary | ICD-10-CM | POA: Diagnosis not present

## 2020-12-15 DIAGNOSIS — E1165 Type 2 diabetes mellitus with hyperglycemia: Secondary | ICD-10-CM | POA: Diagnosis not present

## 2020-12-17 DIAGNOSIS — R0789 Other chest pain: Secondary | ICD-10-CM | POA: Diagnosis not present

## 2020-12-17 DIAGNOSIS — R55 Syncope and collapse: Secondary | ICD-10-CM | POA: Diagnosis not present
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
## 2021-01-02 ENCOUNTER — Ambulatory Visit: Payer: PPO | Admitting: Cardiology

## 2021-01-02 ENCOUNTER — Encounter: Payer: Self-pay | Admitting: Cardiology

## 2021-01-02 ENCOUNTER — Ambulatory Visit (INDEPENDENT_AMBULATORY_CARE_PROVIDER_SITE_OTHER): Payer: PPO

## 2021-01-02 ENCOUNTER — Other Ambulatory Visit: Payer: Self-pay

## 2021-01-02 VITALS — BP 130/80 | HR 65 | Ht 68.0 in | Wt 173.0 lb

## 2021-01-02 DIAGNOSIS — I25118 Atherosclerotic heart disease of native coronary artery with other forms of angina pectoris: Secondary | ICD-10-CM | POA: Diagnosis not present

## 2021-01-02 DIAGNOSIS — E782 Mixed hyperlipidemia: Secondary | ICD-10-CM

## 2021-01-02 DIAGNOSIS — I10 Essential (primary) hypertension: Secondary | ICD-10-CM | POA: Diagnosis not present

## 2021-01-02 DIAGNOSIS — R002 Palpitations: Secondary | ICD-10-CM

## 2021-01-02 NOTE — Patient Instructions (Addendum)
Medication Instructions:  Your physician recommends that you continue on your current medications as directed. Please refer to the Current Medication list given to you today.  *If you need a refill on your cardiac medications before your next appointment, please call your pharmacy*   Lab Work: None If you have labs (blood work) drawn today and your tests are completely normal, you will receive your results only by: Marland Kitchen MyChart Message (if you have MyChart) OR . A paper copy in the mail If you have any lab test that is abnormal or we need to change your treatment, we will call you to review the results.   Testing/Procedures: A zio monitor was ordered today. It will remain on for 14 days. You will then return monitor and event diary in provided box. It takes 1-2 weeks for report to be downloaded and returned to [place]. We will call you with the results. If monitor falls off or has orange flashing light, please call Zio for further instructions.      Follow-Up: At [facility], you and your health needs are our priority.  As part of our continuing mission to provide you with exceptional heart care, we have created designated Provider Care Teams.  These Care Teams include your primary Cardiologist (physician) and Advanced Practice Providers (APPs -  Physician Assistants and Nurse Practitioners) who all work together to provide you with the care you need, when you need it.  We recommend signing up for the patient portal called "MyChart".  Sign up information is provided on this After Visit Summary.  MyChart is used to connect with patients for Virtual Visits (Telemedicine).  Patients are able to view lab/test results, encounter notes, upcoming appointments, etc.  Non-urgent messages can be sent to your provider as well.   To learn more about what you can do with MyChart, go to [email or web address].    Your next appointment:   8 week(s)  The format for your next appointment:   In  Person  Provider:   Shirlee More, MD   Other Instructions [email or web address] [email or web address]        FDA-cleared, clinical grade mobile EKG monitor: Jodelle Red is the most clinically-validated mobile EKG used by the world's leading cardiac care medical professionals With Basic service, know instantly if your heart rhythm is normal or if atrial fibrillation is detected, and email the last single EKG recording to yourself or your doctor Premium service, available for purchase through the Kardia app for $9.99 per month or $99 per year, includes unlimited history and storage of your EKG recordings, a monthly EKG summary report to share with your doctor, along with the ability to track your blood pressure, activity and weight Includes one KardiaMobile phone clip FREE SHIPPING: Standard delivery 1-3 business days. Orders placed by 11:00am PST will ship that afternoon. Otherwise, will ship next business day. All orders ship via [employer] from [place], [place]

## 2021-01-13 DIAGNOSIS — R0981 Nasal congestion: Secondary | ICD-10-CM | POA: Diagnosis not present

## 2021-01-16 DIAGNOSIS — R002 Palpitations: Secondary | ICD-10-CM

## 2021-01-24 DIAGNOSIS — R002 Palpitations: Secondary | ICD-10-CM | POA: Diagnosis not present

## 2021-01-25 ENCOUNTER — Telehealth: Payer: Self-pay

## 2021-01-25 NOTE — Telephone Encounter (Signed)
Patient is returning call.  °

## 2021-01-25 NOTE — Telephone Encounter (Signed)
Left message on patients voicemail to please return our call.   

## 2021-01-25 NOTE — Telephone Encounter (Signed)
Spoke with patient regarding results and recommendation.  Patient verbalizes understanding and is agreeable to plan of care. Advised patient to call back with any issues or concerns.  

## 2021-01-25 NOTE — Telephone Encounter (Signed)
-----   Message from Richardo Priest, MD sent at 01/25/2021  9:48 AM EDT ----- His monitor is good normal.  This is reassuring.

## 2021-02-21 DIAGNOSIS — E1165 Type 2 diabetes mellitus with hyperglycemia: Secondary | ICD-10-CM | POA: Diagnosis not present

## 2021-02-21 DIAGNOSIS — M2550 Pain in unspecified joint: Secondary | ICD-10-CM | POA: Diagnosis not present

## 2021-02-21 DIAGNOSIS — Z1211 Encounter for screening for malignant neoplasm of colon: Secondary | ICD-10-CM | POA: Diagnosis not present

## 2021-02-21 DIAGNOSIS — Z125 Encounter for screening for malignant neoplasm of prostate: Secondary | ICD-10-CM | POA: Diagnosis not present

## 2021-02-21 DIAGNOSIS — E782 Mixed hyperlipidemia: Secondary | ICD-10-CM | POA: Diagnosis not present

## 2021-02-21 DIAGNOSIS — F418 Other specified anxiety disorders: Secondary | ICD-10-CM | POA: Diagnosis not present

## 2021-02-21 DIAGNOSIS — I10 Essential (primary) hypertension: Secondary | ICD-10-CM | POA: Diagnosis not present

## 2021-02-21 DIAGNOSIS — Z0000 Encounter for general adult medical examination without abnormal findings: Secondary | ICD-10-CM | POA: Diagnosis not present

## 2021-03-02 ENCOUNTER — Ambulatory Visit: Payer: PPO | Admitting: Cardiology

## 2021-04-04 DIAGNOSIS — R030 Elevated blood-pressure reading, without diagnosis of hypertension: Secondary | ICD-10-CM | POA: Diagnosis not present

## 2021-04-04 DIAGNOSIS — Z6826 Body mass index (BMI) 26.0-26.9, adult: Secondary | ICD-10-CM | POA: Diagnosis not present

## 2021-04-04 DIAGNOSIS — M48062 Spinal stenosis, lumbar region with neurogenic claudication: Secondary | ICD-10-CM | POA: Diagnosis not present

## 2021-04-19 DIAGNOSIS — M48062 Spinal stenosis, lumbar region with neurogenic claudication: Secondary | ICD-10-CM | POA: Diagnosis not present

## 2021-04-19 DIAGNOSIS — M545 Low back pain, unspecified: Secondary | ICD-10-CM | POA: Diagnosis not present

## 2021-05-05 IMAGING — CR DG MYELOGRAPHY LUMBAR INJ LUMBOSACRAL
12 of 18 series · 12 of 18 positions shown · non-contrast
Comparison: Lumbar spine MRI 04/12/2019

CLINICAL DATA: Lumbar spinal stenosis with neurogenic claudication.
Multiple prior lumbar surgeries. Worsening numbness and weakness in
the lower extremities, greater on the left.
TECHNIQUE: Contiguous axial images were obtained through the Lumbar spine after
the intrathecal infusion of infusion. Coronal and sagittal
reconstructions were obtained of the axial image sets.

[ortho standard (1 of 11)]
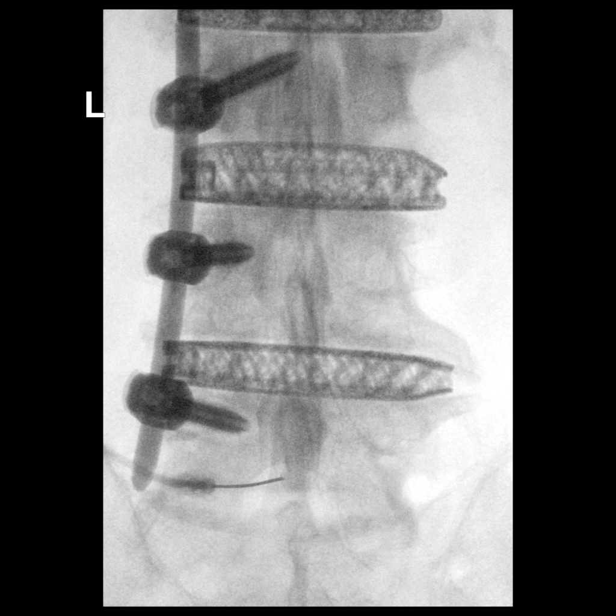

[w lumbar spine flexion]
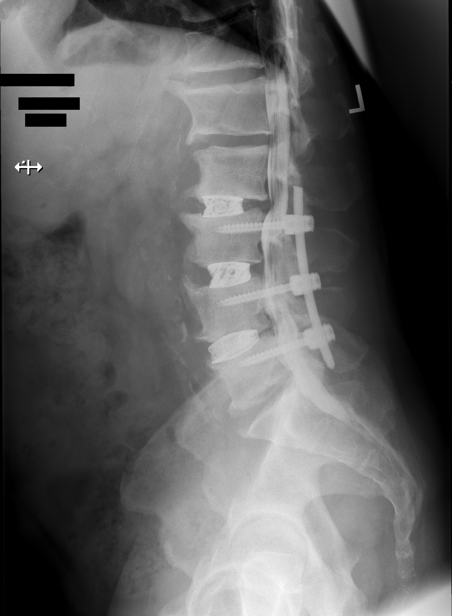

[ortho standard (2 of 11)]
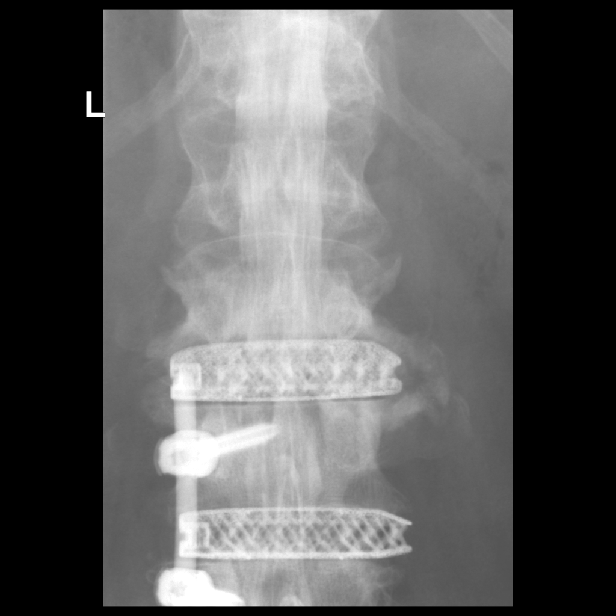

[ortho standard (3 of 11)]
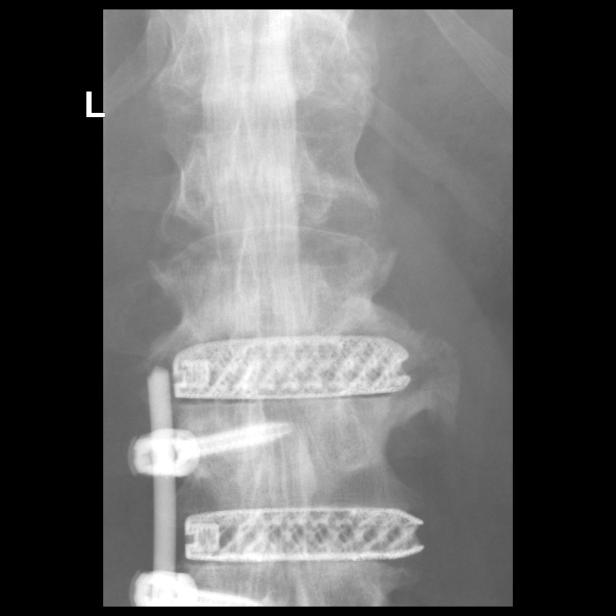

[ortho standard (4 of 11)]
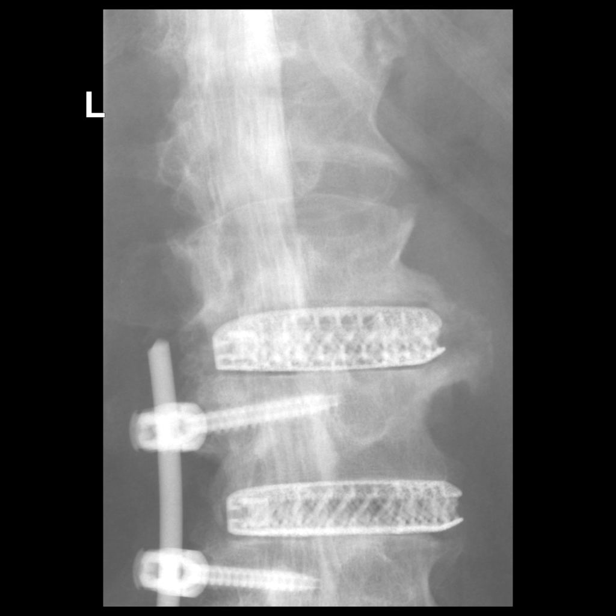

[ortho standard (5 of 11)]
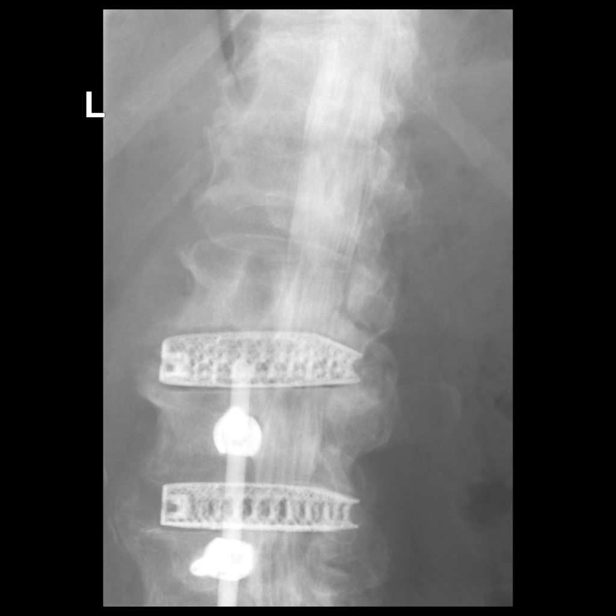

[ortho standard (6 of 11)]
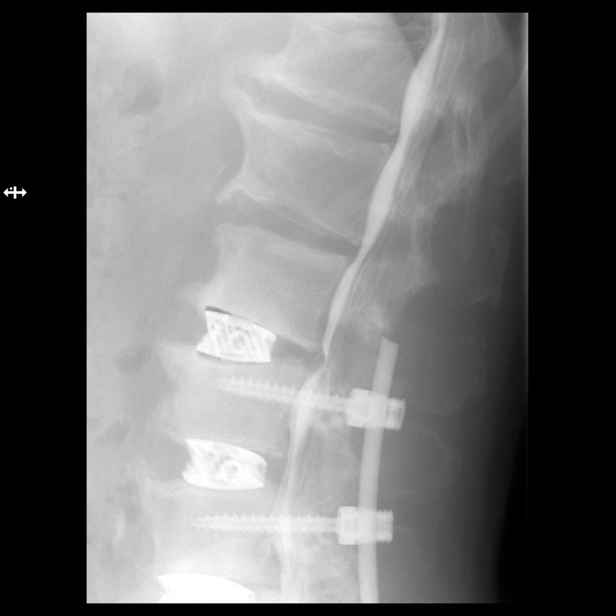

[ortho standard (7 of 11)]
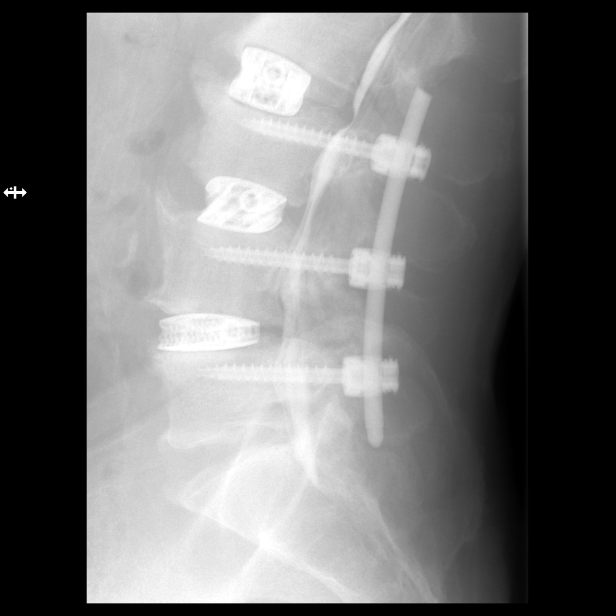

[ortho standard (8 of 11)]
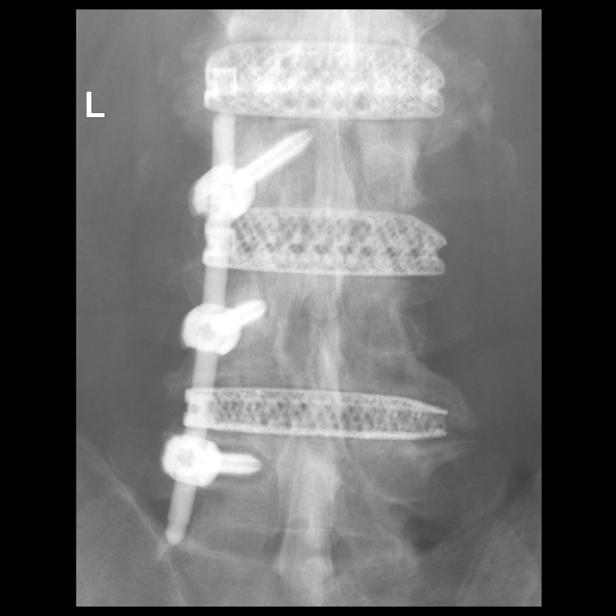

[ortho standard (9 of 11)]
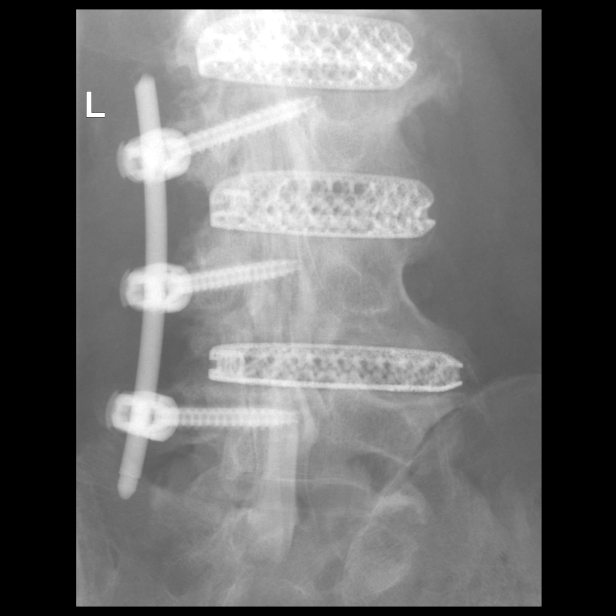

[ortho standard (10 of 11)]
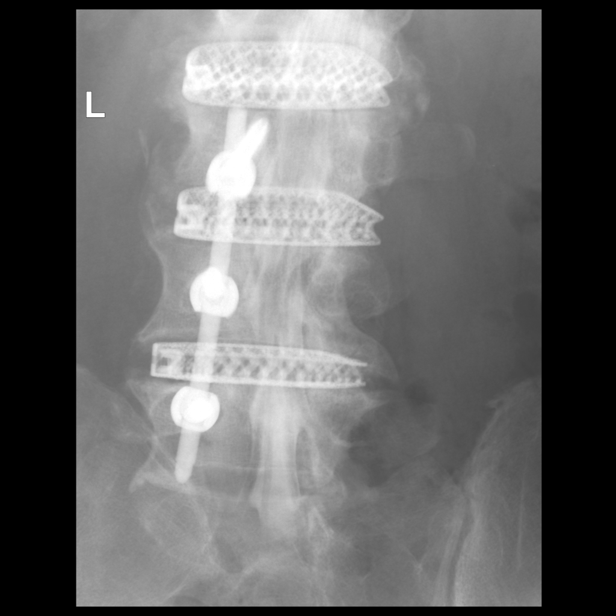

[ortho standard (11 of 11)]
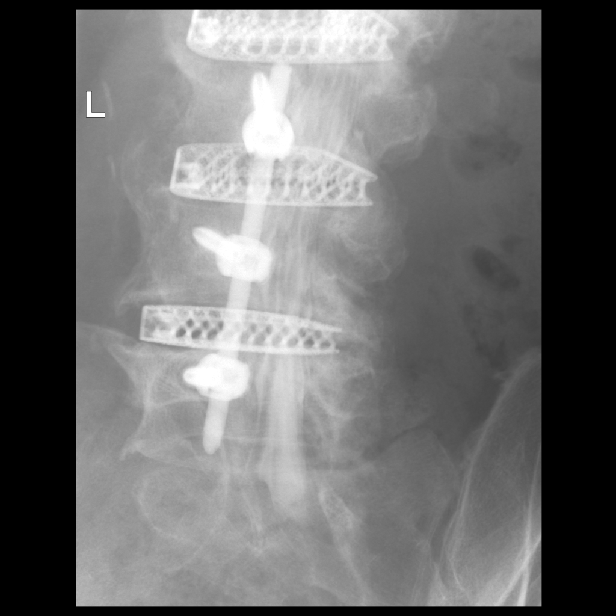

[12 of 18 positions shown; findings below may reference images not displayed]

EXAM:
LUMBAR MYELOGRAM

FLUOROSCOPY TIME:  Fluoroscopy Time: 12 seconds

Radiation Exposure Index: 242.59 microGray*m^2

PROCEDURE:
After thorough discussion of risks and benefits of the procedure
including bleeding, infection, injury to nerves, blood vessels,
adjacent structures as well as headache and CSF leak, written and
oral informed consent was obtained. Consent was obtained by Dr.
Jeyanthi Mild. Time out form was completed.

Patient was positioned prone on the fluoroscopy table. Local
anesthesia was provided with 1% lidocaine without epinephrine after
prepped and draped in the usual sterile fashion. Puncture was
performed at L5-S1 using a 3 1/2 inch 22-gauge spinal needle via a
left interlaminar approach. Using a single pass through the dura,
the needle was placed within the thecal sac, with return of clear
CSF. 15 mL of Isovue Q-JAA was injected into the thecal sac, with
normal opacification of the nerve roots and cauda equina consistent
with free flow within the subarachnoid space.

I personally performed the lumbar puncture and administered the
intrathecal contrast. I also personally supervised acquisition of
the myelogram images.
FINDINGS: LUMBAR MYELOGRAM FINDINGS:

There are 5 non rib-bearing lumbar type vertebrae. Sequelae of L2-L5
fusion are again identified. There is minimal retrolisthesis of L2
on L3 without significant change during flexion or extension. A
ventral extradural defect at L2-3 results in at least mild spinal
stenosis. A small ventral extradural defect is also present at L4-5
without evidence of significant spinal stenosis.

CT LUMBAR MYELOGRAM FINDINGS:

There is trace retrolisthesis of L2 on L3. No acute fracture is
identified. There is chronic anterior wedging of the T12 vertebral
body with multiple Schmorl's nodes at T12 and L1. Mild anterior
wedging of the T11 vertebral body is also partially visualized.
Congenitally short pedicles are noted throughout the lumbar spine.

Sequelae of L2-L5 lateral lumbar interbody fusion are again
identified. The interbody bone grafts appear at least partially
incorporated at each level. Left-sided pedicle screws remain in
place from L3-L5 with previous removal of the L2 screw. Lucency
surrounding the L5 pedicle screw measures up to 2 mm. Solid
posterior element osseous fusion is not evident at any level. There
are prominent degenerative endplate changes from L1-L5 including
sclerosis and cystic changes/Schmorl's nodes.

The conus medullaris terminates at the lower T12 level. There is
aortic atherosclerosis with mild focal dilatation of the infrarenal
abdominal aorta to a maximal diameter of 2.3 cm, unchanged from 0957
and with appearance suggesting a localized dissection.

T11-12: Broad-based posterior disc osteophyte complex results in a
mild focal impression on the distal spinal cord without significant
stenosis, unchanged. Minimal facet arthrosis.

T12-L1: Primarily anterior spondylosis, ligamentum flavum
hypertrophy, and moderate facet arthrosis without stenosis,
unchanged.

L1-2: Mild disc bulging and mild facet and ligamentum flavum
hypertrophy without stenosis, unchanged.

L2-3: Prior posterior decompression and fusion. Disc bulging,
endplate spurring, and moderate residual facet hypertrophy result in
mild spinal stenosis and moderate bilateral neural foraminal
stenosis. Spinal stenosis is slightly less prominent than what was
suggested based on the MRI appearance

L3-4: Prior fusion. Mild disc bulging, endplate spurring, and
mild-to-moderate facet and ligamentum flavum hypertrophy result in
borderline spinal stenosis and mild left neural foraminal stenosis,
unchanged.

L4-5: Prior posterior decompression and fusion. Endplate spurring
and severe facet hypertrophy result in moderate bilateral neural
foraminal stenosis and mild left lateral recess stenosis without
spinal stenosis, unchanged.

L5-S1: Disc bulging, endplate spurring, and mild facet hypertrophy
result in mild-to-moderate right and mild left neural foraminal
stenosis without spinal stenosis, unchanged.
IMPRESSION: 1. L2-L5 fusion as above.  Mild residual spinal stenosis L2-3.
2. Moderate neural foraminal stenosis at L2-3 and L4-5 and
mild-to-moderate neural foraminal stenosis at L5-S1.

## 2021-05-29 DIAGNOSIS — M5416 Radiculopathy, lumbar region: Secondary | ICD-10-CM | POA: Diagnosis not present

## 2021-05-29 DIAGNOSIS — G629 Polyneuropathy, unspecified: Secondary | ICD-10-CM | POA: Diagnosis not present

## 2021-05-29 DIAGNOSIS — M48062 Spinal stenosis, lumbar region with neurogenic claudication: Secondary | ICD-10-CM | POA: Diagnosis not present

## 2021-06-07 DIAGNOSIS — M5416 Radiculopathy, lumbar region: Secondary | ICD-10-CM | POA: Diagnosis not present

## 2021-06-14 DIAGNOSIS — R030 Elevated blood-pressure reading, without diagnosis of hypertension: Secondary | ICD-10-CM | POA: Diagnosis not present

## 2021-06-14 DIAGNOSIS — M5416 Radiculopathy, lumbar region: Secondary | ICD-10-CM | POA: Diagnosis not present

## 2021-06-14 DIAGNOSIS — M48062 Spinal stenosis, lumbar region with neurogenic claudication: Secondary | ICD-10-CM | POA: Diagnosis not present

## 2021-06-14 DIAGNOSIS — E119 Type 2 diabetes mellitus without complications: Secondary | ICD-10-CM | POA: Diagnosis not present

## 2021-06-20 DIAGNOSIS — M5416 Radiculopathy, lumbar region: Secondary | ICD-10-CM | POA: Diagnosis not present

## 2021-07-02 DIAGNOSIS — K5792 Diverticulitis of intestine, part unspecified, without perforation or abscess without bleeding: Secondary | ICD-10-CM | POA: Diagnosis not present

## 2021-07-02 DIAGNOSIS — R509 Fever, unspecified: Secondary | ICD-10-CM | POA: Diagnosis not present

## 2021-07-02 DIAGNOSIS — F5104 Psychophysiologic insomnia: Secondary | ICD-10-CM | POA: Diagnosis not present

## 2021-07-31 DIAGNOSIS — Z23 Encounter for immunization: Secondary | ICD-10-CM | POA: Diagnosis not present

## 2021-10-31 ENCOUNTER — Other Ambulatory Visit: Payer: Self-pay | Admitting: Neurosurgery

## 2021-10-31 DIAGNOSIS — M48062 Spinal stenosis, lumbar region with neurogenic claudication: Secondary | ICD-10-CM

## 2021-11-05 ENCOUNTER — Ambulatory Visit
Admission: RE | Admit: 2021-11-05 | Discharge: 2021-11-05 | Disposition: A | Discharge status: Home / Self Care | Payer: PPO | Source: Ambulatory Visit | Attending: Neurosurgery | Admitting: Neurosurgery

## 2021-11-05 DIAGNOSIS — M48062 Spinal stenosis, lumbar region with neurogenic claudication: Secondary | ICD-10-CM

## 2021-11-05 MED ORDER — MEPERIDINE HCL 50 MG/ML IJ SOLN: 50.0000 mg | Freq: Once | INTRAMUSCULAR | Status: DC | PRN

## 2021-11-05 MED ORDER — DIAZEPAM 5 MG PO TABS
5.0000 mg | ORAL_TABLET | Freq: Once | ORAL | Status: AC
  Administered 2021-11-05: 5 mg via ORAL

## 2021-11-05 MED ORDER — IOPAMIDOL (ISOVUE-M 200) INJECTION 41%
20.0000 mL | Freq: Once | INTRAMUSCULAR | Status: AC
  Administered 2021-11-05: 20 mL via INTRATHECAL

## 2021-11-05 MED ORDER — ONDANSETRON HCL 4 MG/2ML IJ SOLN: 4.0000 mg | Freq: Once | INTRAMUSCULAR | Status: DC | PRN

## 2021-11-05 NOTE — Discharge Instructions (Signed)

## 2021-11-09 ENCOUNTER — Other Ambulatory Visit: Payer: Self-pay | Admitting: Neurosurgery

## 2021-11-19 NOTE — Progress Notes (Signed)
Surgical Instructions ? ? ? Your procedure is scheduled on 11/23/21. ? Report to [facility] Main Entrance "A" at 7:30 A.M., then check in with the Admitting office. ? Call this number if you have problems the morning of surgery: ? [phone] ? ? If you have any questions prior to your surgery date call [phone]: Open Monday-Friday 8am-4pm ? ? ? Remember: ? Do not eat after midnight the night before your surgery ? ?You may drink clear liquids until 6:30 the morning of your surgery.   ?Clear liquids allowed are: Water, Non-Citrus Juices (without pulp), Carbonated Beverages, Clear Tea, Black Coffee ONLY (NO MILK, CREAM OR POWDERED CREAMER of any kind), and Gatorade ?  ? Take these medicines the morning of surgery with A SIP OF WATER:  ?pantoprazole (PROTONIX)  ?pregabalin (LYRICA)  ? ?AS NEEDED: ?fluticasone (FLONASE)  ?HYDROcodone-acetaminophen ([place]) ?hydrOXYzine (ATARAX) ?methocarbamol (ROBAXIN) ?naphazoline-pheniramine (NAPHCON-A)  ? ? ?As of today, STOP taking any Aspirin (unless otherwise instructed by your surgeon) meloxicam (MOBIC), Aleve, Naproxen, Ibuprofen, Motrin, Advil, Goody's, BC's, all herbal medications, fish oil, and all vitamins. ? ?WHAT DO I DO ABOUT MY DIABETES MEDICATION? ? ? ?Do not take oral diabetes medicines (pills) the morning of surgery.  DO NOT take METFORMIN morning of surgery. ? ? ?HOW TO MANAGE YOUR DIABETES ?BEFORE AND AFTER SURGERY ? ?Why is it important to control my blood sugar before and after surgery? ?Improving blood sugar levels before and after surgery helps healing and can limit problems. ?A way of improving blood sugar control is eating a healthy diet by: ? Eating less sugar and carbohydrates ? Increasing activity/exercise ? Talking with your doctor about reaching your blood sugar goals ?High blood sugars (greater than 180 mg/dL) can raise your risk of infections and slow your recovery, so you will need to focus on controlling your diabetes during the weeks before  surgery. ?Make sure that the doctor who takes care of your diabetes knows about your planned surgery including the date and location. ? ?How do I manage my blood sugar before surgery? ?Check your blood sugar at least 4 times a day, starting 2 days before surgery, to make sure that the level is not too high or low. ? ?Check your blood sugar the morning of your surgery when you wake up and every 2 hours until you get to the Short Stay unit. ? ?If your blood sugar is less than 70 mg/dL, you will need to treat for low blood sugar: ?Do not take insulin. ?Treat a low blood sugar (less than 70 mg/dL) with ? cup of clear juice (cranberry or apple), 4 glucose tablets, OR glucose gel. ?Recheck blood sugar in 15 minutes after treatment (to make sure it is greater than 70 mg/dL). If your blood sugar is not greater than 70 mg/dL on recheck, call [phone] for further instructions. ?Report your blood sugar to the short stay nurse when you get to Short Stay. ? ?If you are admitted to the hospital after surgery: ?Your blood sugar will be checked by the staff and you will probably be given insulin after surgery (instead of oral diabetes medicines) to make sure you have good blood sugar levels. ?The goal for blood sugar control after surgery is 80-180 mg/dL.  ? ?         ?Do not wear jewelry  ?Do not wear lotions, powders, colognes, or deodorant. ?Do not shave 48 hours prior to surgery.  Men may shave face and neck. ?Do not bring valuables to the hospital. ? ? ?  [facility] is not responsible for any belongings or valuables. .  ? ?Do NOT Smoke (Tobacco/Vaping)  24 hours prior to your procedure ? ?If you use a CPAP at night, you may bring your mask for your overnight stay. ?  ?Contacts, glasses, hearing aids, dentures or partials may not be worn into surgery, please bring cases for these belongings ?  ?For patients admitted to the hospital, discharge time will be determined by your treatment team. ?  ?Patients discharged the day of  surgery will not be allowed to drive home, and someone needs to stay with them for 24 hours. ? ? ?SURGICAL WAITING ROOM VISITATION ?Patients having surgery or a procedure in a hospital may have two support people. ?Children under the age of 31 must have an adult with them who is not the patient. ?They may stay in the waiting area during the procedure and may switch out with other visitors. If the patient needs to stay at the hospital during part of their recovery, the visitor guidelines for inpatient rooms apply. ? ?Please refer to the [facility] website for the visitor guidelines for Inpatients (after your surgery is over and you are in a regular room).  ? ? ? ?Special instructions:   ? ?Oral Hygiene is also important to reduce your risk of infection.  Remember - BRUSH YOUR TEETH THE MORNING OF SURGERY WITH YOUR REGULAR TOOTHPASTE ? ? ?[facility]- Preparing For Surgery ? ?Before surgery, you can play an important role. Because skin is not sterile, your skin needs to be as free of germs as possible. You can reduce the number of germs on your skin by washing with CHG (chlorahexidine gluconate) Soap before surgery.  CHG is an antiseptic cleaner which kills germs and bonds with the skin to continue killing germs even after washing.   ? ? ?Please do not use if you have an allergy to CHG or antibacterial soaps. If your skin becomes reddened/irritated stop using the CHG.  ?Do not shave (including legs and underarms) for at least 48 hours prior to first CHG shower. It is OK to shave your face. ? ?Please follow these instructions carefully. ?  ? ? Shower the NIGHT BEFORE SURGERY and the MORNING OF SURGERY with CHG Soap.  ? If you chose to wash your hair, wash your hair first as usual with your normal shampoo. After you shampoo, rinse your hair and body thoroughly to remove the shampoo.  Then [employer] and genitals (private parts) with your normal soap and rinse thoroughly to remove soap. ? ?After that Use CHG Soap as you  would any other liquid soap. You can apply CHG directly to the skin and wash gently with a scrungie or a clean washcloth.  ? ?Apply the CHG Soap to your body ONLY FROM THE NECK DOWN.  Do not use on open wounds or open sores. Avoid contact with your eyes, ears, mouth and genitals (private parts). Wash Face and genitals (private parts)  with your normal soap.  ? ?Wash thoroughly, paying special attention to the area where your surgery will be performed. ? ?Thoroughly rinse your body with warm water from the neck down. ? ?DO NOT shower/wash with your normal soap after using and rinsing off the CHG Soap. ? ?Pat yourself dry with a CLEAN TOWEL. ? ?Wear CLEAN PAJAMAS to bed the night before surgery ? ?Place CLEAN SHEETS on your bed the night before your surgery ? ?DO NOT SLEEP WITH PETS. ? ? ?Day of Surgery: ? ?  Take a shower with CHG soap. ?Wear Clean/Comfortable clothing the morning of surgery ?Do not apply any deodorants/lotions.   ?Remember to brush your teeth WITH YOUR REGULAR TOOTHPASTE. ? ? ? ?If you received a COVID test during your pre-op visit  it is requested that you wear a mask when out in public, stay away from anyone that may not be feeling well and notify your surgeon if you develop symptoms. If you have been in contact with anyone that has tested positive in the last 10 days please notify you surgeon. ? ?  ?Please read over the following fact sheets that you were given.   ?

## 2021-11-20 ENCOUNTER — Inpatient Hospital Stay (HOSPITAL_COMMUNITY)
Admission: RE | Admit: 2021-11-20 | Discharge: 2021-11-20 | Disposition: A | Discharge status: Home / Self Care | Payer: PPO | Source: Ambulatory Visit

## 2021-11-20 NOTE — Progress Notes (Signed)
Patient had PAT appointment at 11:00 o'clock for his surgery on 11/23/2021 to have lab work and to received instructions. Patient didn't come. Patient was called by this Probation officer and he verbalized that he didn't know that he has this appointment today. Patient said that he knows that he must be at the hospital on Friday at 6 AM for surgery. This Probation officer explained to patient that he will be called by our surgical scheduler to reschedule his appointment. Surgical scheduler was notified.  ?

## 2021-11-22 ENCOUNTER — Encounter (HOSPITAL_COMMUNITY): Payer: Self-pay

## 2021-11-22 ENCOUNTER — Other Ambulatory Visit: Payer: Self-pay

## 2021-11-22 ENCOUNTER — Encounter (HOSPITAL_COMMUNITY)
Admission: RE | Admit: 2021-11-22 | Discharge: 2021-11-22 | Disposition: A | Discharge status: Home / Self Care | Payer: PPO | Source: Ambulatory Visit | Attending: Neurosurgery | Admitting: Neurosurgery

## 2021-11-22 VITALS — BP 133/78 | HR 67 | Temp 98.4°F | Resp 17 | Ht 68.0 in | Wt 173.3 lb

## 2021-11-22 DIAGNOSIS — I2510 Atherosclerotic heart disease of native coronary artery without angina pectoris: Secondary | ICD-10-CM | POA: Diagnosis not present

## 2021-11-22 DIAGNOSIS — M48061 Spinal stenosis, lumbar region without neurogenic claudication: Secondary | ICD-10-CM | POA: Insufficient documentation

## 2021-11-22 DIAGNOSIS — Z87891 Personal history of nicotine dependence: Secondary | ICD-10-CM | POA: Insufficient documentation

## 2021-11-22 DIAGNOSIS — K219 Gastro-esophageal reflux disease without esophagitis: Secondary | ICD-10-CM | POA: Diagnosis not present

## 2021-11-22 DIAGNOSIS — I252 Old myocardial infarction: Secondary | ICD-10-CM | POA: Diagnosis not present

## 2021-11-22 DIAGNOSIS — F419 Anxiety disorder, unspecified: Secondary | ICD-10-CM | POA: Insufficient documentation

## 2021-11-22 DIAGNOSIS — E119 Type 2 diabetes mellitus without complications: Secondary | ICD-10-CM | POA: Insufficient documentation

## 2021-11-22 DIAGNOSIS — I10 Essential (primary) hypertension: Secondary | ICD-10-CM | POA: Insufficient documentation

## 2021-11-22 DIAGNOSIS — Z01818 Encounter for other preprocedural examination: Secondary | ICD-10-CM

## 2021-11-22 DIAGNOSIS — Z01812 Encounter for preprocedural laboratory examination: Secondary | ICD-10-CM | POA: Diagnosis present

## 2021-11-22 DIAGNOSIS — E785 Hyperlipidemia, unspecified: Secondary | ICD-10-CM | POA: Insufficient documentation

## 2021-11-22 LAB — SURGICAL PCR SCREEN
MRSA, PCR: NEGATIVE
Staphylococcus aureus: NEGATIVE

## 2021-11-22 LAB — CBC
HCT: 36.5 % — ABNORMAL LOW (ref 39.0–52.0)
Hemoglobin: 12.3 g/dL — ABNORMAL LOW (ref 13.0–17.0)
MCH: 30.8 pg (ref 26.0–34.0)
MCHC: 33.7 g/dL (ref 30.0–36.0)
MCV: 91.5 fL (ref 80.0–100.0)
Platelets: 249 10*3/uL (ref 150–400)
RBC: 3.99 MIL/uL — ABNORMAL LOW (ref 4.22–5.81)
RDW: 12.5 % (ref 11.5–15.5)
WBC: 6.4 10*3/uL (ref 4.0–10.5)
nRBC: 0 % (ref 0.0–0.2)

## 2021-11-22 LAB — BASIC METABOLIC PANEL
Anion gap: 7 (ref 5–15)
BUN: 15 mg/dL (ref 8–23)
CO2: 26 mmol/L (ref 22–32)
Calcium: 9.6 mg/dL (ref 8.9–10.3)
Chloride: 102 mmol/L (ref 98–111)
Creatinine, Ser: 1.05 mg/dL (ref 0.61–1.24)
GFR, Estimated: >60 mL/min (ref >60)
Glucose, Bld: 206 mg/dL — ABNORMAL HIGH (ref 70–99)
Potassium: 5.1 mmol/L (ref 3.5–5.1)
Sodium: 135 mmol/L (ref 135–145)

## 2021-11-22 LAB — GLUCOSE, CAPILLARY: Glucose-Capillary: 222 mg/dL — ABNORMAL HIGH (ref 70–99)

## 2021-11-22 LAB — TYPE AND SCREEN
ABO/RH(D): A NEG
Antibody Screen: NEGATIVE

## 2021-11-22 LAB — HEMOGLOBIN A1C
Hgb A1c MFr Bld: 7.2 % — ABNORMAL HIGH (ref 4.8–5.6)
Mean Plasma Glucose: 159.94 mg/dL

## 2021-11-22 NOTE — Anesthesia Preprocedure Evaluation (Addendum)
Anesthesia Evaluation  ?Patient identified by MRN, date of birth, ID band ?Patient awake ? ? ? ?Reviewed: ?Allergy & Precautions, NPO status , Patient's Chart, lab work & pertinent test results ? ?Airway ?Mallampati: II ? ?TM Distance: >3 FB ?Neck ROM: Full ? ? ? Dental ? ?(+) Upper Dentures, Dental Advisory Given ?  ?Pulmonary ?sleep apnea , former smoker,  ?  ?breath sounds clear to auscultation ? ? ? ? ? ? Cardiovascular ?hypertension, + CAD, + Past MI and + Cardiac Stents  ? ?Rhythm:Regular Rate:Normal ? ? ?  ?Neuro/Psych ?Anxiety  Neuromuscular disease   ? GI/Hepatic ?Neg liver ROS, GERD  Medicated,  ?Endo/Other  ?diabetes, Type 2, Oral Hypoglycemic Agents ? Renal/GU ?negative Renal ROS  ? ?  ?Musculoskeletal ? ?(+) Arthritis ,  ? Abdominal ?Normal abdominal exam  (+)   ?Peds ? Hematology ?  ?Anesthesia Other Findings ? ? Reproductive/Obstetrics ? ?  ? ? ? ? ? ? ? ? ? ? ? ? ? ?  ?  ? ? ? ? ? ? ? ?Anesthesia Physical ?Anesthesia Plan ? ?ASA: 3 ? ?Anesthesia Plan: General  ? ?Post-op Pain Management:   ? ?Induction: Intravenous ? ?PONV Risk Score and Plan: 3 and Ondansetron, Dexamethasone and Midazolam ? ?Airway Management Planned: Oral ETT and Video Laryngoscope Planned ? ?Additional Equipment: None ? ?Intra-op Plan:  ? ?Post-operative Plan: Extubation in OR ? ?Informed Consent: I have reviewed the patients History and Physical, chart, labs and discussed the procedure including the risks, benefits and alternatives for the proposed anesthesia with the patient or authorized representative who has indicated his/her understanding and acceptance.  ? ? ? ?Dental advisory given ? ?Plan Discussed with: CRNA ? ?Anesthesia Plan Comments: (PAT note written 11/22/2021 by Myra Gianotti, PA-C. ?)  ? ? ? ? ? ?Anesthesia Quick Evaluation ? ?

## 2021-11-22 NOTE — Progress Notes (Addendum)
PCP - Audree Camel. Unk Lightning MD ?Cardiologist - Richardo Priest MD ? ?PPM/ICD - Denies ?Device Orders -  ?Rep Notified -  ? ?Chest x-ray -  ?EKG - 01/02/21 ?Stress Test -  ?ECHO - 04/07/18 ?Cardiac Cath - 01/14/11 ? ?Sleep Study - no results found in chart review or Care Everywhere ?CPAP - pt states he does not use a CPAP anymore.  ? ?Fasting Blood Sugar - 140's ?Checks Blood Sugar every other day ? ?Blood Thinner Instructions:n/a ?Aspirin Instructions:pt reports his last dose of aspirin was 3/24. ? ?ERAS Protcol -clear liquids until 0630. ?PRE-SURGERY Ensure or G2- no ? ?COVID TEST- n/a ? ? ?Anesthesia review: yes- cardiac history.  ? ?Patient denies shortness of breath, fever, cough and chest pain at PAT appointment ? ? ?All instructions explained to the patient, with a verbal understanding of the material. Patient agrees to go over the instructions while at home for a better understanding. Patient also instructed to wear a mask while out in public prior to surgery. The opportunity to ask questions was provided. ?  ?
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
## 2021-11-23 ENCOUNTER — Ambulatory Visit (HOSPITAL_COMMUNITY): Payer: PPO | Admitting: Physician Assistant

## 2021-11-23 ENCOUNTER — Other Ambulatory Visit: Payer: Self-pay

## 2021-11-23 ENCOUNTER — Observation Stay (HOSPITAL_COMMUNITY)
Admission: RE | Admit: 2021-11-23 | Discharge: 2021-11-24 | Disposition: A | Discharge status: Home / Self Care | Payer: PPO | Source: Ambulatory Visit | Attending: Neurosurgery | Admitting: Neurosurgery

## 2021-11-23 ENCOUNTER — Ambulatory Visit (HOSPITAL_BASED_OUTPATIENT_CLINIC_OR_DEPARTMENT_OTHER): Payer: PPO | Admitting: Physician Assistant

## 2021-11-23 ENCOUNTER — Ambulatory Visit (HOSPITAL_COMMUNITY): Payer: PPO

## 2021-11-23 ENCOUNTER — Encounter (HOSPITAL_COMMUNITY)
Admission: RE | Disposition: A | Discharge status: Home / Self Care | Payer: Self-pay | Source: Ambulatory Visit | Attending: Neurosurgery

## 2021-11-23 ENCOUNTER — Encounter (HOSPITAL_COMMUNITY): Payer: Self-pay | Admitting: Neurosurgery

## 2021-11-23 DIAGNOSIS — Z7984 Long term (current) use of oral hypoglycemic drugs: Secondary | ICD-10-CM | POA: Insufficient documentation

## 2021-11-23 DIAGNOSIS — Z79899 Other long term (current) drug therapy: Secondary | ICD-10-CM | POA: Diagnosis not present

## 2021-11-23 DIAGNOSIS — Z7982 Long term (current) use of aspirin: Secondary | ICD-10-CM | POA: Diagnosis not present

## 2021-11-23 DIAGNOSIS — I252 Old myocardial infarction: Secondary | ICD-10-CM

## 2021-11-23 DIAGNOSIS — S32029K Unspecified fracture of second lumbar vertebra, subsequent encounter for fracture with nonunion: Secondary | ICD-10-CM | POA: Insufficient documentation

## 2021-11-23 DIAGNOSIS — S32039K Unspecified fracture of third lumbar vertebra, subsequent encounter for fracture with nonunion: Secondary | ICD-10-CM | POA: Diagnosis not present

## 2021-11-23 DIAGNOSIS — I2510 Atherosclerotic heart disease of native coronary artery without angina pectoris: Secondary | ICD-10-CM | POA: Diagnosis not present

## 2021-11-23 DIAGNOSIS — M960 Pseudarthrosis after fusion or arthrodesis: Secondary | ICD-10-CM

## 2021-11-23 DIAGNOSIS — Z87891 Personal history of nicotine dependence: Secondary | ICD-10-CM | POA: Diagnosis not present

## 2021-11-23 DIAGNOSIS — I10 Essential (primary) hypertension: Secondary | ICD-10-CM | POA: Diagnosis not present

## 2021-11-23 DIAGNOSIS — M48062 Spinal stenosis, lumbar region with neurogenic claudication: Secondary | ICD-10-CM | POA: Diagnosis present

## 2021-11-23 DIAGNOSIS — E119 Type 2 diabetes mellitus without complications: Secondary | ICD-10-CM | POA: Insufficient documentation

## 2021-11-23 DIAGNOSIS — X58XXXD Exposure to other specified factors, subsequent encounter: Secondary | ICD-10-CM | POA: Diagnosis not present

## 2021-11-23 DIAGNOSIS — S32009K Unspecified fracture of unspecified lumbar vertebra, subsequent encounter for fracture with nonunion: Secondary | ICD-10-CM | POA: Diagnosis present

## 2021-11-23 HISTORY — PX: LAMINECTOMY WITH POSTERIOR LATERAL ARTHRODESIS LEVEL 1: SHX6335

## 2021-11-23 LAB — GLUCOSE, CAPILLARY
Glucose-Capillary: 160 mg/dL — ABNORMAL HIGH (ref 70–99)
Glucose-Capillary: 147 mg/dL — ABNORMAL HIGH (ref 70–99)
Glucose-Capillary: 211 mg/dL — ABNORMAL HIGH (ref 70–99)
Glucose-Capillary: 239 mg/dL — ABNORMAL HIGH (ref 70–99)

## 2021-11-23 SURGERY — LAMINECTOMY WITH POSTERIOR LATERAL ARTHRODESIS LEVEL 1
Anesthesia: General | Site: Back

## 2021-11-23 MED ORDER — LIDOCAINE 2% (20 MG/ML) 5 ML SYRINGE
INTRAMUSCULAR | Status: DC | PRN
  Administered 2021-11-23: 40 mg via INTRAVENOUS

## 2021-11-23 MED ORDER — INSULIN ASPART 100 UNIT/ML IJ SOLN
0.0000 [IU] | Freq: Three times a day (TID) | INTRAMUSCULAR | Status: DC
  Administered 2021-11-24: 3 [IU] via SUBCUTANEOUS

## 2021-11-23 MED ORDER — 0.9 % SODIUM CHLORIDE (POUR BTL) OPTIME
TOPICAL | Status: DC | PRN
  Administered 2021-11-23: 1000 mL

## 2021-11-23 MED ORDER — MELOXICAM 7.5 MG PO TABS
15.0000 mg | ORAL_TABLET | Freq: Every day | ORAL | Status: DC
  Administered 2021-11-24: 15 mg via ORAL
  Filled 2021-11-23: qty 2

## 2021-11-23 MED ORDER — PROPOFOL 10 MG/ML IV BOLUS
INTRAVENOUS | Status: DC | PRN
  Administered 2021-11-23: 130 mg via INTRAVENOUS

## 2021-11-23 MED ORDER — ROCURONIUM BROMIDE 10 MG/ML (PF) SYRINGE
PREFILLED_SYRINGE | INTRAVENOUS | Status: DC | PRN
  Administered 2021-11-23 (×2): 20 mg via INTRAVENOUS
  Administered 2021-11-23: 60 mg via INTRAVENOUS

## 2021-11-23 MED ORDER — PHENOL 1.4 % MT LIQD: 1.0000 | OROMUCOSAL | Status: DC | PRN

## 2021-11-23 MED ORDER — FLEET ENEMA 7-19 GM/118ML RE ENEM: 1.0000 | ENEMA | Freq: Once | RECTAL | Status: DC | PRN

## 2021-11-23 MED ORDER — BISACODYL 10 MG RE SUPP: 10.0000 mg | Freq: Every day | RECTAL | Status: DC | PRN

## 2021-11-23 MED ORDER — PROPOFOL 10 MG/ML IV BOLUS
INTRAVENOUS | Status: AC
  Filled 2021-11-23: qty 20

## 2021-11-23 MED ORDER — LACTATED RINGERS IV SOLN: INTRAVENOUS | Status: DC

## 2021-11-23 MED ORDER — SODIUM CHLORIDE 0.9% FLUSH
3.0000 mL | Freq: Two times a day (BID) | INTRAVENOUS | Status: DC
  Administered 2021-11-23: 3 mL via INTRAVENOUS

## 2021-11-23 MED ORDER — ONDANSETRON HCL 4 MG/2ML IJ SOLN: 4.0000 mg | Freq: Four times a day (QID) | INTRAMUSCULAR | Status: DC | PRN

## 2021-11-23 MED ORDER — SODIUM CHLORIDE 0.9 % IV SOLN
250.0000 mL | INTRAVENOUS | Status: DC
  Administered 2021-11-23: 250 mL via INTRAVENOUS

## 2021-11-23 MED ORDER — HYDROMORPHONE HCL 1 MG/ML IJ SOLN
0.2500 mg | INTRAMUSCULAR | Status: DC | PRN
  Administered 2021-11-23: 0.5 mg via INTRAVENOUS

## 2021-11-23 MED ORDER — FLUTICASONE PROPIONATE 50 MCG/ACT NA SUSP
1.0000 | Freq: Every day | NASAL | Status: DC | PRN
  Filled 2021-11-23: qty 16

## 2021-11-23 MED ORDER — MIDAZOLAM HCL 2 MG/2ML IJ SOLN
INTRAMUSCULAR | Status: AC
  Filled 2021-11-23: qty 2

## 2021-11-23 MED ORDER — AMISULPRIDE (ANTIEMETIC) 5 MG/2ML IV SOLN: 10.0000 mg | Freq: Once | INTRAVENOUS | Status: DC | PRN

## 2021-11-23 MED ORDER — HYDROCODONE-ACETAMINOPHEN 10-325 MG PO TABS
1.0000 | ORAL_TABLET | ORAL | Status: DC | PRN
  Administered 2021-11-23 – 2021-11-24 (×4): 1 via ORAL
  Filled 2021-11-23 (×4): qty 1

## 2021-11-23 MED ORDER — EPHEDRINE SULFATE-NACL 50-0.9 MG/10ML-% IV SOSY
PREFILLED_SYRINGE | INTRAVENOUS | Status: DC | PRN
  Administered 2021-11-23: 10 mg via INTRAVENOUS

## 2021-11-23 MED ORDER — HYDROMORPHONE HCL 1 MG/ML IJ SOLN: 1.0000 mg | INTRAMUSCULAR | Status: DC | PRN

## 2021-11-23 MED ORDER — BUPIVACAINE HCL (PF) 0.25 % IJ SOLN
INTRAMUSCULAR | Status: AC
  Filled 2021-11-23: qty 30

## 2021-11-23 MED ORDER — DEXAMETHASONE SODIUM PHOSPHATE 10 MG/ML IJ SOLN
INTRAMUSCULAR | Status: DC | PRN
  Administered 2021-11-23: 5 mg via INTRAVENOUS

## 2021-11-23 MED ORDER — NAPHAZOLINE-PHENIRAMINE 0.025-0.3 % OP SOLN
1.0000 [drp] | Freq: Four times a day (QID) | OPHTHALMIC | Status: DC | PRN
  Filled 2021-11-23: qty 15

## 2021-11-23 MED ORDER — ACETAMINOPHEN 650 MG RE SUPP: 650.0000 mg | RECTAL | Status: DC | PRN

## 2021-11-23 MED ORDER — DIAZEPAM 5 MG PO TABS
5.0000 mg | ORAL_TABLET | Freq: Four times a day (QID) | ORAL | Status: DC | PRN
  Administered 2021-11-24: 5 mg via ORAL
  Filled 2021-11-23: qty 1

## 2021-11-23 MED ORDER — ASPIRIN EC 81 MG PO TBEC
81.0000 mg | DELAYED_RELEASE_TABLET | Freq: Every day | ORAL | Status: DC
  Administered 2021-11-23 – 2021-11-24 (×2): 81 mg via ORAL
  Filled 2021-11-23 (×2): qty 1

## 2021-11-23 MED ORDER — INSULIN ASPART 100 UNIT/ML IJ SOLN: 0.0000 [IU] | INTRAMUSCULAR | Status: DC | PRN

## 2021-11-23 MED ORDER — PHENYLEPHRINE 40 MCG/ML (10ML) SYRINGE FOR IV PUSH (FOR BLOOD PRESSURE SUPPORT)
PREFILLED_SYRINGE | INTRAVENOUS | Status: AC
  Filled 2021-11-23: qty 20

## 2021-11-23 MED ORDER — ACETAMINOPHEN 10 MG/ML IV SOLN
INTRAVENOUS | Status: AC
  Administered 2021-11-23: 1000 mg via INTRAVENOUS
  Filled 2021-11-23: qty 100

## 2021-11-23 MED ORDER — PHENYLEPHRINE 40 MCG/ML (10ML) SYRINGE FOR IV PUSH (FOR BLOOD PRESSURE SUPPORT)
PREFILLED_SYRINGE | INTRAVENOUS | Status: DC | PRN
  Administered 2021-11-23: 120 ug via INTRAVENOUS
  Administered 2021-11-23: 40 ug via INTRAVENOUS
  Administered 2021-11-23: 120 ug via INTRAVENOUS
  Administered 2021-11-23 (×2): 80 ug via INTRAVENOUS

## 2021-11-23 MED ORDER — VITAMIN D (ERGOCALCIFEROL) 1.25 MG (50000 UNIT) PO CAPS: 50000.0000 [IU] | ORAL_CAPSULE | ORAL | Status: DC

## 2021-11-23 MED ORDER — ONDANSETRON HCL 4 MG PO TABS: 4.0000 mg | ORAL_TABLET | Freq: Four times a day (QID) | ORAL | Status: DC | PRN

## 2021-11-23 MED ORDER — THROMBIN 20000 UNITS EX SOLR
CUTANEOUS | Status: DC | PRN
  Administered 2021-11-23: 20 mL via TOPICAL

## 2021-11-23 MED ORDER — THROMBIN 20000 UNITS EX SOLR
CUTANEOUS | Status: AC
  Filled 2021-11-23: qty 20000

## 2021-11-23 MED ORDER — OXYCODONE HCL 5 MG PO TABS: 10.0000 mg | ORAL_TABLET | ORAL | Status: DC | PRN

## 2021-11-23 MED ORDER — ACETAMINOPHEN 10 MG/ML IV SOLN: 1000.0000 mg | Freq: Once | INTRAVENOUS | Status: DC | PRN

## 2021-11-23 MED ORDER — ONDANSETRON HCL 4 MG/2ML IJ SOLN
INTRAMUSCULAR | Status: AC
  Filled 2021-11-23: qty 2

## 2021-11-23 MED ORDER — EPHEDRINE 5 MG/ML INJ
INTRAVENOUS | Status: AC
  Filled 2021-11-23: qty 5

## 2021-11-23 MED ORDER — METFORMIN HCL 500 MG PO TABS
1000.0000 mg | ORAL_TABLET | Freq: Two times a day (BID) | ORAL | Status: DC
  Administered 2021-11-24: 1000 mg via ORAL
  Filled 2021-11-23: qty 2

## 2021-11-23 MED ORDER — PREGABALIN 75 MG PO CAPS
75.0000 mg | ORAL_CAPSULE | Freq: Three times a day (TID) | ORAL | Status: DC
  Administered 2021-11-23 – 2021-11-24 (×3): 75 mg via ORAL
  Filled 2021-11-23 (×3): qty 1

## 2021-11-23 MED ORDER — MEPERIDINE HCL 25 MG/ML IJ SOLN: 6.2500 mg | INTRAMUSCULAR | Status: DC | PRN

## 2021-11-23 MED ORDER — ZOLPIDEM TARTRATE 5 MG PO TABS: 10.0000 mg | ORAL_TABLET | Freq: Every day | ORAL | Status: DC

## 2021-11-23 MED ORDER — MENTHOL 3 MG MT LOZG: 1.0000 | LOZENGE | OROMUCOSAL | Status: DC | PRN

## 2021-11-23 MED ORDER — ACETAMINOPHEN 325 MG PO TABS: 650.0000 mg | ORAL_TABLET | ORAL | Status: DC | PRN

## 2021-11-23 MED ORDER — MIDAZOLAM HCL 2 MG/2ML IJ SOLN
INTRAMUSCULAR | Status: DC | PRN
  Administered 2021-11-23: 2 mg via INTRAVENOUS

## 2021-11-23 MED ORDER — POLYETHYLENE GLYCOL 3350 17 G PO PACK: 17.0000 g | PACK | Freq: Every day | ORAL | Status: DC | PRN

## 2021-11-23 MED ORDER — SUGAMMADEX SODIUM 200 MG/2ML IV SOLN
INTRAVENOUS | Status: DC | PRN
  Administered 2021-11-23: 400 mg via INTRAVENOUS

## 2021-11-23 MED ORDER — ONDANSETRON HCL 4 MG/2ML IJ SOLN
INTRAMUSCULAR | Status: DC | PRN
  Administered 2021-11-23: 4 mg via INTRAVENOUS

## 2021-11-23 MED ORDER — ACETAMINOPHEN 325 MG PO TABS: 325.0000 mg | ORAL_TABLET | Freq: Once | ORAL | Status: DC | PRN

## 2021-11-23 MED ORDER — BUPIVACAINE HCL (PF) 0.25 % IJ SOLN
INTRAMUSCULAR | Status: DC | PRN
  Administered 2021-11-23: 30 mL

## 2021-11-23 MED ORDER — CHLORHEXIDINE GLUCONATE CLOTH 2 % EX PADS: 6.0000 | MEDICATED_PAD | Freq: Once | CUTANEOUS | Status: DC

## 2021-11-23 MED ORDER — HYDROXYZINE HCL 25 MG PO TABS: 25.0000 mg | ORAL_TABLET | Freq: Three times a day (TID) | ORAL | Status: DC | PRN

## 2021-11-23 MED ORDER — PHENYLEPHRINE HCL-NACL 20-0.9 MG/250ML-% IV SOLN
INTRAVENOUS | Status: DC | PRN
  Administered 2021-11-23: 10 ug/min via INTRAVENOUS

## 2021-11-23 MED ORDER — ORAL CARE MOUTH RINSE: 15.0000 mL | Freq: Once | OROMUCOSAL | Status: AC

## 2021-11-23 MED ORDER — VITAMIN B-12 1000 MCG PO TABS
5000.0000 ug | ORAL_TABLET | Freq: Every day | ORAL | Status: DC
  Administered 2021-11-23 – 2021-11-24 (×2): 5000 ug via ORAL
  Filled 2021-11-23 (×2): qty 5

## 2021-11-23 MED ORDER — VANCOMYCIN HCL 1000 MG IV SOLR
INTRAVENOUS | Status: AC
  Filled 2021-11-23: qty 20

## 2021-11-23 MED ORDER — CEFAZOLIN SODIUM-DEXTROSE 2-4 GM/100ML-% IV SOLN
2.0000 g | INTRAVENOUS | Status: AC
  Administered 2021-11-23: 2 g via INTRAVENOUS
  Filled 2021-11-23: qty 100

## 2021-11-23 MED ORDER — CEFAZOLIN SODIUM-DEXTROSE 1-4 GM/50ML-% IV SOLN
1.0000 g | Freq: Three times a day (TID) | INTRAVENOUS | Status: AC
  Administered 2021-11-23 – 2021-11-24 (×2): 1 g via INTRAVENOUS
  Filled 2021-11-23 (×2): qty 50

## 2021-11-23 MED ORDER — INSULIN ASPART 100 UNIT/ML IJ SOLN
0.0000 [IU] | Freq: Every day | INTRAMUSCULAR | Status: DC
  Administered 2021-11-23: 2 [IU] via SUBCUTANEOUS

## 2021-11-23 MED ORDER — METHOCARBAMOL 500 MG PO TABS
250.0000 mg | ORAL_TABLET | Freq: Two times a day (BID) | ORAL | Status: DC | PRN
  Administered 2021-11-23: 250 mg via ORAL
  Filled 2021-11-23: qty 1

## 2021-11-23 MED ORDER — ACETAMINOPHEN 160 MG/5ML PO SOLN: 325.0000 mg | Freq: Once | ORAL | Status: DC | PRN

## 2021-11-23 MED ORDER — FENTANYL CITRATE (PF) 250 MCG/5ML IJ SOLN
INTRAMUSCULAR | Status: AC
  Filled 2021-11-23: qty 5

## 2021-11-23 MED ORDER — LIDOCAINE 2% (20 MG/ML) 5 ML SYRINGE
INTRAMUSCULAR | Status: AC
  Filled 2021-11-23: qty 5

## 2021-11-23 MED ORDER — PRAVASTATIN SODIUM 10 MG PO TABS
10.0000 mg | ORAL_TABLET | Freq: Every day | ORAL | Status: DC
  Administered 2021-11-23: 10 mg via ORAL
  Filled 2021-11-23: qty 1

## 2021-11-23 MED ORDER — DEXAMETHASONE SODIUM PHOSPHATE 10 MG/ML IJ SOLN
INTRAMUSCULAR | Status: AC
  Filled 2021-11-23: qty 1

## 2021-11-23 MED ORDER — ROCURONIUM BROMIDE 10 MG/ML (PF) SYRINGE
PREFILLED_SYRINGE | INTRAVENOUS | Status: AC
  Filled 2021-11-23: qty 20

## 2021-11-23 MED ORDER — PANTOPRAZOLE SODIUM 40 MG PO TBEC
40.0000 mg | DELAYED_RELEASE_TABLET | Freq: Every day | ORAL | Status: DC
  Administered 2021-11-24: 40 mg via ORAL
  Filled 2021-11-23: qty 1

## 2021-11-23 MED ORDER — VANCOMYCIN HCL 1000 MG IV SOLR
INTRAVENOUS | Status: DC | PRN
  Administered 2021-11-23: 1000 mg via TOPICAL

## 2021-11-23 MED ORDER — CHLORHEXIDINE GLUCONATE 0.12 % MT SOLN
15.0000 mL | Freq: Once | OROMUCOSAL | Status: AC
  Administered 2021-11-23: 15 mL via OROMUCOSAL
  Filled 2021-11-23: qty 15

## 2021-11-23 MED ORDER — FENTANYL CITRATE (PF) 250 MCG/5ML IJ SOLN
INTRAMUSCULAR | Status: DC | PRN
  Administered 2021-11-23: 75 ug via INTRAVENOUS
  Administered 2021-11-23: 50 ug via INTRAVENOUS
  Administered 2021-11-23: 75 ug via INTRAVENOUS

## 2021-11-23 MED ORDER — SODIUM CHLORIDE 0.9% FLUSH
3.0000 mL | INTRAVENOUS | Status: DC | PRN
  Administered 2021-11-23: 3 mL via INTRAVENOUS

## 2021-11-23 MED ORDER — HYDROMORPHONE HCL 1 MG/ML IJ SOLN
INTRAMUSCULAR | Status: AC
  Administered 2021-11-23: 0.5 mg via INTRAVENOUS
  Filled 2021-11-23: qty 1

## 2021-11-23 SURGICAL SUPPLY — 59 items
BAG COUNTER SPONGE SURGICOUNT (BAG) ×5 IMPLANT
BAG DECANTER FOR FLEXI CONT (MISCELLANEOUS) ×2 IMPLANT
BENZOIN TINCTURE PRP APPL 2/3 (GAUZE/BANDAGES/DRESSINGS) ×2 IMPLANT
BLADE CLIPPER SURG (BLADE) IMPLANT
BUR CUTTER 7.0 ROUND (BURR) ×2 IMPLANT
BUR MATCHSTICK NEURO 3.0 LAGG (BURR) ×2 IMPLANT
CANISTER SUCT 3000ML PPV (MISCELLANEOUS) ×2 IMPLANT
CARTRIDGE OIL MAESTRO DRILL (MISCELLANEOUS) ×1 IMPLANT
CNTNR URN SCR LID CUP LEK RST (MISCELLANEOUS) ×1 IMPLANT
CONT SPEC 4OZ STRL OR WHT (MISCELLANEOUS) ×1
COVER BACK TABLE 60X90IN (DRAPES) ×2 IMPLANT
DECANTER SPIKE VIAL GLASS SM (MISCELLANEOUS) ×2 IMPLANT
DERMABOND ADHESIVE PROPEN (GAUZE/BANDAGES/DRESSINGS) ×1
DERMABOND ADVANCED (GAUZE/BANDAGES/DRESSINGS) ×1
DERMABOND ADVANCED .7 DNX12 (GAUZE/BANDAGES/DRESSINGS) ×1 IMPLANT
DERMABOND ADVANCED .7 DNX6 (GAUZE/BANDAGES/DRESSINGS) IMPLANT
DIFFUSER DRILL AIR PNEUMATIC (MISCELLANEOUS) ×2 IMPLANT
DRAPE C-ARM 42X72 X-RAY (DRAPES) ×4 IMPLANT
DRAPE HALF SHEET 40X57 (DRAPES) IMPLANT
DRAPE LAPAROTOMY 100X72X124 (DRAPES) ×2 IMPLANT
DRAPE SURG 17X23 STRL (DRAPES) ×8 IMPLANT
DRSG OPSITE POSTOP 4X8 (GAUZE/BANDAGES/DRESSINGS) ×1 IMPLANT
DURAPREP 26ML APPLICATOR (WOUND CARE) ×2 IMPLANT
ELECT REM PT RETURN 9FT ADLT (ELECTROSURGICAL) ×2
ELECTRODE REM PT RTRN 9FT ADLT (ELECTROSURGICAL) ×1 IMPLANT
EVACUATOR 1/8 PVC DRAIN (DRAIN) ×2 IMPLANT
GAUZE 4X4 16PLY ~~LOC~~+RFID DBL (SPONGE) ×1 IMPLANT
GAUZE SPONGE 4X4 12PLY STRL (GAUZE/BANDAGES/DRESSINGS) ×2 IMPLANT
GLOVE EXAM NITRILE XL STR (GLOVE) IMPLANT
GLOVE SURG LTX SZ9 (GLOVE) ×4 IMPLANT
GOWN STRL REUS W/ TWL LRG LVL3 (GOWN DISPOSABLE) IMPLANT
GOWN STRL REUS W/ TWL XL LVL3 (GOWN DISPOSABLE) ×2 IMPLANT
GOWN STRL REUS W/TWL 2XL LVL3 (GOWN DISPOSABLE) IMPLANT
GOWN STRL REUS W/TWL LRG LVL3 (GOWN DISPOSABLE)
GOWN STRL REUS W/TWL XL LVL3 (GOWN DISPOSABLE) ×2
KIT BASIN OR (CUSTOM PROCEDURE TRAY) ×2 IMPLANT
KIT INFUSE X SMALL 1.4CC (Orthopedic Implant) ×1 IMPLANT
KIT TURNOVER KIT B (KITS) ×2 IMPLANT
MILL MEDIUM DISP (BLADE) ×1 IMPLANT
NEEDLE HYPO 22GX1.5 SAFETY (NEEDLE) ×2 IMPLANT
NS IRRIG 1000ML POUR BTL (IV SOLUTION) ×2 IMPLANT
OIL CARTRIDGE MAESTRO DRILL (MISCELLANEOUS) ×2
PACK LAMINECTOMY NEURO (CUSTOM PROCEDURE TRAY) ×2 IMPLANT
ROD RELINE 0-0 CON M 5.0/6.0MM (Rod) ×1 IMPLANT
ROD RELINE-O LORD 5.5X40 (Rod) ×1 IMPLANT
ROD RELINE-O LORDOTIC 5.5X60MM (Rod) ×1 IMPLANT
SCREW LOCK RELINE 5.5 TULIP (Screw) ×5 IMPLANT
SCREW RELINE-O POLY 6.5X45 (Screw) ×3 IMPLANT
SPONGE SURGIFOAM ABS GEL 100 (HEMOSTASIS) ×2 IMPLANT
SPONGE T-LAP 4X18 ~~LOC~~+RFID (SPONGE) ×2 IMPLANT
STRIP CLOSURE SKIN 1/2X4 (GAUZE/BANDAGES/DRESSINGS) ×3 IMPLANT
SUT VIC AB 0 CT1 18XCR BRD8 (SUTURE) ×2 IMPLANT
SUT VIC AB 0 CT1 8-18 (SUTURE) ×1
SUT VIC AB 2-0 CT1 18 (SUTURE) ×2 IMPLANT
SUT VIC AB 3-0 SH 8-18 (SUTURE) ×4 IMPLANT
TOWEL GREEN STERILE (TOWEL DISPOSABLE) ×2 IMPLANT
TOWEL GREEN STERILE FF (TOWEL DISPOSABLE) ×2 IMPLANT
TRAY FOLEY MTR SLVR 16FR STAT (SET/KITS/TRAYS/PACK) ×2 IMPLANT
WATER STERILE IRR 1000ML POUR (IV SOLUTION) ×2 IMPLANT

## 2021-11-23 NOTE — Transfer of Care (Signed)
Immediate Anesthesia Transfer of Care Note ? ?Patient: Bryan Mcguire ? ?Procedure(s) Performed: Laminectomy and Foraminotomy - Lumbar Two-Lumbar Three with posterior lateral fusion (Back) ? ?Patient Location: PACU ? ?Anesthesia Type:General ? ?Level of Consciousness: drowsy ? ?Airway & Oxygen Therapy: Patient Spontanous Breathing and Patient connected to face mask oxygen ? ?Post-op Assessment: Report given to RN and Post -op Vital signs reviewed and stable ? ?Post vital signs: Reviewed and stable ? ?Last Vitals:  ?Vitals Value Taken Time  ?BP    ?Temp    ?Pulse 97 11/23/21 1315  ?Resp 13 11/23/21 1315  ?SpO2 99 % 11/23/21 1315  ?Vitals shown include unvalidated device data. ? ?Last Pain:  ?Vitals:  ? 11/23/21 0754  ?TempSrc:   ?PainSc: 4   ?   ? ?Patients Stated Pain Goal: 2 (11/23/21 0754) ? ?Complications: No notable events documented. ?

## 2021-11-23 NOTE — Anesthesia Postprocedure Evaluation (Signed)
Anesthesia Post Note ? ?Patient: Bryan Mcguire ? ?Procedure(s) Performed: Laminectomy and Foraminotomy - Lumbar Two-Lumbar Three with posterior lateral fusion (Back) ? ?  ? ?Patient location during evaluation: PACU ?Anesthesia Type: General ?Level of consciousness: awake and alert ?Pain management: pain level controlled ?Vital Signs Assessment: post-procedure vital signs reviewed and stable ?Respiratory status: spontaneous breathing, nonlabored ventilation, respiratory function stable and patient connected to nasal cannula oxygen ?Cardiovascular status: blood pressure returned to baseline and stable ?Postop Assessment: no apparent nausea or vomiting ?Anesthetic complications: no ? ? ?No notable events documented. ? ?Last Vitals:  ?Vitals:  ? 11/23/21 1420 11/23/21 1435  ?BP: (!) 167/83 (!) 162/81  ?Pulse: 72 71  ?Resp: 16 11  ?Temp: 36.6 ?C   ?SpO2: 100% 99%  ?  ?Last Pain:  ?Vitals:  ? 11/23/21 1420  ?TempSrc:   ?PainSc: 3   ? ? ?  ?  ?  ?  ?  ?  ? ?Effie Berkshire ? ? ? ? ?

## 2021-11-23 NOTE — Anesthesia Procedure Notes (Signed)
Procedure Name: Intubation ?Date/Time: 11/23/2021 10:35 AM ?Performed by: Vonna Drafts, CRNA ?Pre-anesthesia Checklist: Patient identified, Emergency Drugs available, Suction available and Patient being monitored ?Patient Re-evaluated:Patient Re-evaluated prior to induction ?Oxygen Delivery Method: Circle system utilized ?Preoxygenation: Pre-oxygenation with 100% oxygen ?Induction Type: IV induction ?Ventilation: Mask ventilation without difficulty ?Laryngoscope Size: Glidescope and 4 ?Grade View: Grade I ?Tube type: Oral ?Tube size: 7.5 mm ?Number of attempts: 1 ?Airway Equipment and Method: Stylet and Oral airway ?Placement Confirmation: ETT inserted through vocal cords under direct vision, positive ETCO2 and breath sounds checked- equal and bilateral ?Secured at: 23 cm ?Tube secured with: Tape ?Dental Injury: Teeth and Oropharynx as per pre-operative assessment  ? ? ? ? ?

## 2021-11-23 NOTE — Brief Op Note (Signed)
11/23/2021 ? ?1:09 PM ? ?PATIENT:  Bryan Mcguire  71 y.o. male ? ?PRE-OPERATIVE DIAGNOSIS:  Stenosis ? ?POST-OPERATIVE DIAGNOSIS:  Stenosis ? ?PROCEDURE:  Procedure(s) with comments: ?Laminectomy and Foraminotomy - Lumbar Two-Lumbar Three with posterior lateral fusion (N/A) - Laminectomy and Foraminotomy - Lumbar Two-Lumbar Three with posterior lateral fusion ? ?SURGEON:  Surgeon(s) and Role: ?   Earnie Larsson, MD - Primary ? ?PHYSICIAN ASSISTANT:  ? ?ASSISTANTS: Bergman,NP  ? ?ANESTHESIA:   general ? ?EBL:  300 mL  ? ?BLOOD ADMINISTERED:none ? ?DRAINS: none  ? ?LOCAL MEDICATIONS USED:  MARCAINE    ? ?SPECIMEN:  No Specimen ? ?DISPOSITION OF SPECIMEN:  N/A ? ?COUNTS:  YES ? ?TOURNIQUET:  * No tourniquets in log * ? ?DICTATION: .Dragon Dictation ? ?PLAN OF CARE: Admit for overnight observation ? ?PATIENT DISPOSITION:  PACU - hemodynamically stable. ?  ?Delay start of Pharmacological VTE agent (>24hrs) due to surgical blood loss or risk of bleeding: yes ? ?
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
## 2021-11-23 NOTE — Op Note (Signed)
Date of procedure: 11/23/2021 ? ?Date of dictation: Same ? ?Service: Neurosurgery ? 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 ?Postoperative diagnosis: Same ? ?Procedure Name: Redo L2-3 decompressive laminectomy and foraminotomies ? ?L2-3 posterior lateral arthrodesis utilizing nonsegmental pedicle screw fixation and local autografting with BMP ? ?Surgeon:Diannie Willner A.Yeray Tomas, M.D. ? ?Asst. Surgeon: Reinaldo Meeker, NP ? ?Anesthesia: General ? ?Indication: 71 year old male status post prior L2-L3-L4 and L5 fusions.  Patient status post prior hardware removal L2-3 with L2-3 decompressive surgery.  Patient presents now with worsening pseudoarthrosis and severe recurrent stenosis at L2-3.  Patient presents now for redo decompression and fusion at L2-3. ? ?Operative note: After induction of anesthesia, patient [place] prone onto Wilson frame and properly padded.  Lumbar region prepped and draped sterilely.  Incision made overlying L2 and L3.  Dissection performed bilaterally.  Retractor placed.  Fluoroscopy used.  Levels confirmed.  Previously placed pedicle screws rotation on the left at L3 was dissected free.  Previous bilateral laminotomies at L2-3 were dissected free.  Near complete laminectomy of L2 was then performed using Leksell rongeurs, Kerrison rongeurs and the high-speed drill.  Complete inferior facetectomies of L2 were performed bilaterally.  Superior facetectomies of L3 were performed bilaterally.  The superior aspect of the L3 lamina was also resected.  Ligamentum flavum and epidural scar were elevated and resected.  Foraminotomies completed on the course exiting L2 and L3 nerve roots bilaterally.  The central canal was completely decompressed.  Attention then placed a posterior lateral fusion.  Pedicles of L2 and L3 were identified using surface landmarks and intraoperative fluoroscopy's.  On the left side at L2 a 6.5 x 45 mm NuVasive screw was placed under fluoroscopic  guidance.  At L2 and L3 on the right a 6.5 millimeter screw was placed.  The L2 screw and the L2-3 screws were confirmed to be in good position by AP and lateral fluoroscopy.  Short segment titanium rod placed through the screw heads at L2 and L3 on the right side.  Locking caps placed through the screws and locking caps and engaged with construct and compression.  The connector was placed between the screw heads of L3 and L4 and then connected to a rod.  Locking caps were then engaged to on the left side.  Transverse processes of L2 and L3 were decorticated using high-speed drill.  A extra small BMP was split and placed posterior laterally to assist with fusion.  Morselized autograft was then packed posterior laterally for later fusion.  Gelfoam was placed over the laminectomy defect.  Vancomycin powder was placed in deep wound space.  Wounds and closed in layers with Vicryl sutures.  Steri-Strips and sterile dressing were applied.  No apparent complications.  Patient tolerated the procedure well and he returns to the recovery room postop. ? ?

## 2021-11-24 DIAGNOSIS — M48062 Spinal stenosis, lumbar region with neurogenic claudication: Secondary | ICD-10-CM | POA: Diagnosis not present

## 2021-11-24 LAB — GLUCOSE, CAPILLARY: Glucose-Capillary: 182 mg/dL — ABNORMAL HIGH (ref 70–99)

## 2021-11-24 NOTE — Plan of Care (Signed)
?  Problem: Education: ?Goal: Ability to verbalize activity precautions or restrictions will improve ?Outcome: Completed/Met ?Goal: Knowledge of the prescribed therapeutic regimen will improve ?Outcome: Completed/Met ?Goal: Understanding of discharge needs will improve ?Outcome: Completed/Met ?  ?Problem: Activity: ?Goal: Ability to avoid complications of mobility impairment will improve ?Outcome: Completed/Met ?Goal: Ability to tolerate increased activity will improve ?Outcome: Completed/Met ?Goal: Will remain free from falls ?Outcome: Completed/Met ?  ?Problem: Bowel/Gastric: ?Goal: Gastrointestinal status for postoperative course will improve ?Outcome: Completed/Met ?  ?Problem: Bowel/Gastric: ?Goal: Gastrointestinal status for postoperative course will improve ?Outcome: Completed/Met ?  ?Problem: Clinical Measurements: ?Goal: Ability to maintain clinical measurements within normal limits will improve ?Outcome: Completed/Met ?Goal: Postoperative complications will be avoided or minimized ?Outcome: Completed/Met ?Goal: Diagnostic test results will improve ?Outcome: Completed/Met ?  ?Problem: Pain Management: ?Goal: Pain level will decrease ?Outcome: Completed/Met ?  ?Problem: Skin Integrity: ?Goal: Will show signs of wound healing ?Outcome: Completed/Met ?  ?Problem: Health Behavior/Discharge Planning: ?Goal: Identification of resources available to assist in meeting health care needs will improve ?Outcome: Completed/Met ?  ?Problem: Bladder/Genitourinary: ?Goal: Urinary functional status for postoperative course will improve ?Outcome: Completed/Met ?  ?Problem: Safety: ?Goal: Ability to remain free from injury will improve ?Outcome: Completed/Met ?  ?

## 2021-11-24 NOTE — Progress Notes (Signed)
Patient alert and oriented, mae's well, voiding adequate amount of urine, swallowing without difficulty, no c/o pain at time of discharge. Patient discharged home with family. Script and discharged instructions given to patient. Patient and family stated understanding of instructions given. Patient has an appointment with Dr. Pool in 2 weeks 

## 2021-11-24 NOTE — Evaluation (Signed)
Physical Therapy Evaluation & Discharge ?Patient Details ?Name: Bryan Mcguire ?MRN: [id number] ?DOB: [birth year] ?Today's Date: 11/24/2021 ? ?History of Present Illness ? 71 y/o male admitted on 11/23/21 following PLIF L2-3. PMH: HTN, neuropathy, CAD, DM2, hx of back sx  ?Clinical Impression ? Patient admitted following above procedure. Patient functioning at independent level with mobility with no AD. Patient with good recall of back precautions and compensatory techniques from previous surgeries. Educated patient on progressive walking program, patient vebralized understanding. No further skilled PT needs identified acutely. No PT follow up recommended at this time.  ?   ? ?Recommendations for follow up therapy are one component of a multi-disciplinary discharge planning process, led by the attending physician.  Recommendations may be updated based on patient status, additional functional criteria and insurance authorization. ? ?Follow Up Recommendations No PT follow up ? ?  ?Assistance Recommended at Discharge PRN  ?Patient can return home with the following ?   ? ?  ?Equipment Recommendations None recommended by PT  ?Recommendations for Other Services ?    ?  ?Functional Status Assessment Patient has had a recent decline in their functional status and demonstrates the ability to make significant improvements in function in a reasonable and predictable amount of time.  ? ?  ?Precautions / Restrictions Precautions ?Precautions: Back ?Precaution Booklet Issued: Yes (comment) ?Required Braces or Orthoses: Spinal Brace ?Spinal Brace: Lumbar corset;Applied in standing position ?Restrictions ?Weight Bearing Restrictions: No  ? ?  ? ?Mobility ? Bed Mobility ?Overal bed mobility: Independent ?  ?  ?  ?  ?  ?  ?  ?  ? ?Transfers ?Overall transfer level: Independent ?Equipment used: None ?  ?  ?  ?  ?  ?  ?  ?  ?  ? ?Ambulation/Gait ?Ambulation/Gait assistance: Independent ?Gait Distance (Feet): 400 Feet ?Assistive device:  None ?Gait Pattern/deviations: Decreased stance time - left ?Gait velocity: decreased ?  ?  ?General Gait Details: baseline ambulation with slight decreased stance time on L due to hx of weakness ? ?Stairs ?  ?  ?  ?  ?  ? ?Wheelchair Mobility ?  ? ?Modified Rankin (Stroke Patients Only) ?  ? ?  ? ?Balance Overall balance assessment: Mild deficits observed, not formally tested ?  ?  ?  ?  ?  ?  ?  ?  ?  ?  ?  ?  ?  ?  ?  ?  ?  ?  ?   ? ? ? ?Pertinent Vitals/Pain Pain Assessment ?Pain Assessment: 0-10 ?Pain Score: 3  ?Pain Location: back ?Pain Descriptors / Indicators: Grimacing ?Pain Intervention(s): Monitored during session  ? ? ?Home Living Family/patient expects to be discharged to:: Private residence ?Living Arrangements: Alone ?Available Help at Discharge: Friend(s);Available PRN/intermittently ?Type of Home: House ?Home Access: Stairs to enter ?  ?Entrance Stairs-Number of Steps: 1 ?  ?Home Layout: One level ?Home Equipment: Conservation officer, nature (2 wheels);Cane - single point;Shower seat - built in;Toilet riser;Adaptive equipment ?   ?  ?Prior Function Prior Level of Function : Independent/Modified Independent;Driving ?  ?  ?  ?  ?  ?  ?  ?  ?  ? ? ?Hand Dominance  ?   ? ?  ?Extremity/Trunk Assessment  ? Upper Extremity Assessment ?Upper Extremity Assessment: Overall WFL for tasks assessed ?  ? ?Lower Extremity Assessment ?Lower Extremity Assessment: Overall WFL for tasks assessed ?  ? ?Cervical / Trunk Assessment ?Cervical / Trunk Assessment: Back Surgery  ?Communication  ?  Communication: No difficulties  ?Cognition Arousal/Alertness: Awake/alert ?Behavior During Therapy: [facility] for tasks assessed/performed ?Overall Cognitive Status: Within Functional Limits for tasks assessed ?  ?  ?  ?  ?  ?  ?  ?  ?  ?  ?  ?  ?  ?  ?  ?  ?  ?  ?  ? ?  ?General Comments   ? ?  ?Exercises    ? ?Assessment/Plan  ?  ?PT Assessment Patient does not need any further PT services  ?PT Problem List   ? ?   ?  ?PT Treatment Interventions      ? ?PT Goals (Current goals can be found in the Care Plan section)  ?Acute Rehab PT Goals ?Patient Stated Goal: to go home ?PT Goal Formulation: All assessment and education complete, DC therapy ? ?  ?Frequency   ?  ? ? ?Co-evaluation   ?  ?  ?  ?  ? ? ?  ?AM-PAC PT "6 Clicks" Mobility  ?Outcome Measure Help needed turning from your back to your side while in a flat bed without using bedrails?: None ?Help needed moving from lying on your back to sitting on the side of a flat bed without using bedrails?: None ?Help needed moving to and from a bed to a chair (including a wheelchair)?: None ?Help needed standing up from a chair using your arms (e.g., wheelchair or bedside chair)?: None ?Help needed to walk in hospital room?: None ?Help needed climbing 3-5 steps with a railing? : None ?6 Click Score: 24 ? ?  ?End of Session Equipment Utilized During Treatment: Back brace ?Activity Tolerance: Patient tolerated treatment well ?Patient left: in bed;with call bell/phone within reach ?Nurse Communication: Mobility status ?PT Visit Diagnosis: Muscle weakness (generalized) (M62.81) ?  ? ?Time: 1497-0263 ?PT Time Calculation (min) (ACUTE ONLY): 22 min ? ? ?Charges:   PT Evaluation ?$PT Eval Low Complexity: 1 Low ?  ?  ?   ? ? ?Jaydian Santana A. Gilford Rile, PT, DPT ?Acute Rehabilitation Services ?Pager [phone] ?Office [phone] ? ? ?Alsha Meland A Henny Strauch ?11/24/2021, 9:43 AM ? ?

## 2021-11-24 NOTE — Evaluation (Signed)
Occupational Therapy Evaluation ?Patient Details ?Name: Bryan Mcguire ?MRN: [id number] ?DOB: 08-08-1951 ?Today's Date: 11/24/2021 ? ? ?History of Present Illness 71 y/o male admitted on 11/23/21 following PLIF L2-3. PMH: HTN, neuropathy, CAD, DM2, hx of back sx  ? ?Clinical Impression ?  ?OT eval education complete ?   ? ?Recommendations for follow up therapy are one component of a multi-disciplinary discharge planning process, led by the attending physician.  Recommendations may be updated based on patient status, additional functional criteria and insurance authorization.  ? ?Follow Up Recommendations ? No OT follow up  ?  ?Assistance Recommended at Discharge PRN  ?   ?   ?Equipment Recommendations ? None recommended by OT  ?  ?   ?Precautions / Restrictions Precautions ?Precautions: Back ?Precaution Booklet Issued: Yes (comment) ?Required Braces or Orthoses: Spinal Brace ?Spinal Brace: Lumbar corset;Applied in standing position ?Restrictions ?Weight Bearing Restrictions: No  ? ?  ? ?Mobility Bed Mobility ?Overal bed mobility: Independent ?  ?  ?  ?  ?  ?  ?  ?  ? ?Transfers ?Overall transfer level: Independent ?Equipment used: None ?  ?  ?  ?  ?  ?  ?  ?  ?  ? ?  ?Balance Overall balance assessment: Mild deficits observed, not formally tested ?  ?  ?  ?  ?  ?  ?  ?  ?  ?  ?  ?  ?  ?  ?  ?  ?  ?  ?   ? ?ADL either performed or assessed with clinical judgement  ? ?ADL Overall ADL's : Needs assistance/impaired ?  ?  ?  ?  ?Upper Body Bathing: Independent;Sitting ?  ?Lower Body Bathing: Modified independent;Sit to/from stand;Adhering to back precautions ?  ?Upper Body Dressing : Independent;Sitting ?  ?Lower Body Dressing: Adhering to back precautions;Modified independent ?  ?Toilet Transfer: Modified Independent;Regular Toilet ?  ?Toileting- Clothing Manipulation and Hygiene: Modified independent ?  ?Tub/ Shower Transfer: Walk-in shower ?  ?Functional mobility during ADLs: Modified independent ?General ADL Comments: pt  doing well adhereing to back preautions  ? ? ? ? ?Pertinent Vitals/Pain Pain Assessment ?Pain Score: 3   ? ? ? ?Hand Dominance   ?  ?Extremity/Trunk Assessment Upper Extremity Assessment ?Upper Extremity Assessment: Overall WFL for tasks assessed ?  ?Lower Extremity Assessment ?Lower Extremity Assessment: Overall WFL for tasks assessed ?  ?Cervical / Trunk Assessment ?Cervical / Trunk Assessment: Back Surgery ?  ?Communication Communication ?Communication: No difficulties ?  ?Cognition Arousal/Alertness: Awake/alert ?Behavior During Therapy: [facility] for tasks assessed/performed ?Overall Cognitive Status: Within Functional Limits for tasks assessed ?  ?  ?  ?  ?  ?  ?  ?  ?  ?  ?  ?  ?  ?  ?  ?  ?  ?  ?  ?   ?   ?   ? ? ?Home Living Family/patient expects to be discharged to:: Private residence ?Living Arrangements: Alone ?Available Help at Discharge: Friend(s);Available PRN/intermittently ?Type of Home: House ?Home Access: Stairs to enter ?Entrance Stairs-Number of Steps: 1 ?  ?Home Layout: One level ?  ?  ?Bathroom Shower/Tub: Walk-in shower ?  ?Bathroom Toilet: Standard ?  ?  ?Home Equipment: Conservation officer, nature (2 wheels);Cane - single point;Shower seat - built in;Toilet riser;Adaptive equipment ?Adaptive Equipment: Reacher ?  ?  ? ?  ?Prior Functioning/Environment Prior Level of Function : Independent/Modified Independent;Driving ?  ?  ?  ?  ?  ?  ?  ?  ?  ? ?   ?   ?   ?   ?  OT Goals(Current goals can be found in the care plan section) Acute Rehab OT Goals ?Patient Stated Goal: home today ?OT Goal Formulation: With patient  ?   ?  ? ?   ?AM-PAC OT "6 Clicks" Daily Activity     ?Outcome Measure Help from another person eating meals?: None ?Help from another person taking care of personal grooming?: None ?Help from another person toileting, which includes using toliet, bedpan, or urinal?: None ?Help from another person bathing (including washing, rinsing, drying)?: None ?Help from another person to put on and taking off  regular upper body clothing?: None ?Help from another person to put on and taking off regular lower body clothing?: None ?6 Click Score: 24 ?  ?End of Session Nurse Communication: Mobility status ? ?Activity Tolerance: Patient tolerated treatment well ?Patient left: in bed ? ?   ?              ?Time: 1020-1039 ?OT Time Calculation (min): 19 min ?Charges:  OT General Charges ?$OT Visit: 1 Visit ?OT Evaluation ?$OT Eval Low Complexity: 1 Low ? ?Kari Baars, OT ?Acute Rehabilitation Services ?[phone] ?Office- [phone] ? ?  ? ?Payton Mccallum D ?11/24/2021, 10:56 AM ?

## 2021-11-24 NOTE — Care Management (Signed)
No HH needs identified, unit staff will provide any needed DME for discharge. ?No other TOC needs identified.  ?

## 2021-11-24 NOTE — Discharge Summary (Signed)
Physician Discharge Summary  ?Patient ID: ?Bryan Mcguire ?MRN: [id number] ?DOB/AGE: 03-10-1951 71 y.o. ?Estimated body mass index is 26.3 kg/m? as calculated from the following: ?  Height as of this encounter: '5\' 8"'$  (1.727 m). ?  Weight as of this encounter: 78.5 kg. ? ? ?Admit date: 11/23/2021 ?Discharge date: 11/24/2021 ? ?Admission Diagnoses: Lumbar pseudoarthrosis ? ?Discharge Diagnoses: Same ?Principal Problem: ?  Lumbar pseudoarthrosis ? ? ?Discharged Condition: good ? ?Hospital Course: Patient was admitted to the hospital underwent revision spinal fusion postoperative patient did very well tolerating regular diet stable for discharge ? ?Consults: ?Significant Diagnostic Studies: ?Treatments: ?Revision spinal fusion ?Discharge Exam: ?Blood pressure 127/65, pulse 79, temperature 98.4 ?F (36.9 ?C), temperature source Oral, resp. rate 18, height '5\' 8"'$  (1.727 m), weight 78.5 kg, SpO2 99 %. ?Strength 5 out of 5 wound with mild saturation well change dressing ? ?Disposition: Home ? ? ?Allergies as of 11/24/2021   ? ?   Reactions  ? Duloxetine Other (See Comments)  ? "jittery nervous feeling"  ? ?  ? ?  ?Medication List  ?  ? ?TAKE these medications   ? ?aspirin EC 81 MG tablet ?Take 81 mg by mouth daily. ?  ?fluticasone 50 MCG/ACT nasal spray ?Commonly known as: FLONASE ?Place 1-2 sprays into both nostrils daily as needed for allergies. ?  ?HYDROcodone-acetaminophen 10-325 MG tablet ?Commonly known as: NORCO ?Take 1 tablet by mouth every 6 (six) hours as needed for moderate pain. ?  ?hydrOXYzine 25 MG tablet ?Commonly known as: ATARAX ?Take 1 tablet by mouth 3 (three) times daily as needed for anxiety. ?  ?meloxicam 15 MG tablet ?Commonly known as: MOBIC ?Take 15 mg by mouth daily. ?  ?metFORMIN 1000 MG tablet ?Commonly known as: GLUCOPHAGE ?Take 1,000 mg by mouth 2 (two) times daily with a meal. Breakfast & supper ?  ?methocarbamol 500 MG tablet ?Commonly known as: ROBAXIN ?Take 250 mg by mouth 2 (two) times daily as needed  for muscle spasms. ?  ?naphazoline-pheniramine 0.025-0.3 % ophthalmic solution ?Commonly known as: NAPHCON-A ?Place 1 drop into both eyes 4 (four) times daily as needed for eye irritation or allergies. ?  ?pantoprazole 40 MG tablet ?Commonly known as: PROTONIX ?Take 40 mg by mouth daily. ?  ?pravastatin 10 MG tablet ?Commonly known as: PRAVACHOL ?Take 10 mg by mouth at bedtime. ?  ?pregabalin 75 MG capsule ?Commonly known as: LYRICA ?Take 75 mg by mouth 3 (three) times daily. ?  ?triamcinolone cream 0.1 % ?Commonly known as: KENALOG ?Apply 1 application. topically 2 (two) times daily as needed (itching/rash). ?  ?Vitamin B-12 5000 MCG Tbdp ?Take 5,000 mcg by mouth daily. ?  ?Vitamin D (Ergocalciferol) 1.25 MG (50000 UNIT) Caps capsule ?Commonly known as: DRISDOL ?Take 50,000 Units by mouth every Tuesday. ?  ?zolpidem 10 MG tablet ?Commonly known as: AMBIEN ?Take 10 mg by mouth at bedtime. ?  ? ?  ? ?  ?  ? ? ?  ?Durable Medical Equipment  ?(From admission, onward)  ?  ? ? ?  ? ?  Start     Ordered  ? 11/23/21 1619  DME Walker rolling  Once       ?Question:  Patient needs a walker to treat with the following condition  Answer:  Lumbar pseudoarthrosis  ? 11/23/21 1618  ? 11/23/21 1619  DME 3 n 1  Once       ? 11/23/21 1618  ? ?  ?  ? ?  ? ? ? ?  Signed: ?Elaina Hoops ?11/24/2021, 8:27 AM ?  ? ?

## 2021-11-24 NOTE — Discharge Instructions (Signed)
Wound Care Keep incision covered and dry for three days.  Do not put any creams, lotions, or ointments on incision. Leave steri-strips on back.  They will fall off by themselves. Activity Walk each and every day, increasing distance each day. No lifting greater than 5 lbs.  Avoid excessive neck motion. No driving for 2 weeks; may ride as a passenger locally. If provided with back brace, wear when out of bed.  It is not necessary to wear brace in bed. Diet Resume your normal diet.   Call Your Doctor If Any of These Occur Redness, drainage, or swelling at the wound.  Temperature greater than 101 degrees. Severe pain not relieved by pain medication. Incision starts to come apart. Follow Up Appt Call today for appointment in 1-2 weeks ([phone]) or for problems.  If you have any hardware placed in your spine, you will need an x-ray before your appointment.  

## 2021-11-26 ENCOUNTER — Encounter (HOSPITAL_COMMUNITY): Payer: Self-pay | Admitting: Neurosurgery
